# Patient Record
Sex: Male | Born: 1960 | Race: White | Hispanic: No | Marital: Single | Smoking: Current every day smoker
Health system: Southern US, Community
[De-identification: ages and names within clinical notes are randomized; demographics above are authoritative.]

## PROBLEM LIST (undated history)

## (undated) DIAGNOSIS — I10 Essential (primary) hypertension: Secondary | ICD-10-CM

## (undated) DIAGNOSIS — M109 Gout, unspecified: Secondary | ICD-10-CM

---

## 2001-03-06 ENCOUNTER — Encounter: Payer: Self-pay | Admitting: Emergency Medicine

## 2001-03-06 ENCOUNTER — Emergency Department (HOSPITAL_COMMUNITY): Admission: EM | Admit: 2001-03-06 | Discharge: 2001-03-06 | Payer: Self-pay | Admitting: Emergency Medicine

## 2002-12-21 ENCOUNTER — Emergency Department (HOSPITAL_COMMUNITY): Admission: AD | Admit: 2002-12-21 | Discharge: 2002-12-21 | Payer: Self-pay | Admitting: Family Medicine

## 2003-05-18 ENCOUNTER — Emergency Department (HOSPITAL_COMMUNITY): Admission: AD | Admit: 2003-05-18 | Discharge: 2003-05-18 | Payer: Self-pay | Admitting: Family Medicine

## 2003-12-31 ENCOUNTER — Emergency Department (HOSPITAL_COMMUNITY): Admission: AC | Admit: 2003-12-31 | Discharge: 2003-12-31 | Payer: Self-pay

## 2004-01-06 ENCOUNTER — Emergency Department (HOSPITAL_COMMUNITY): Admission: EM | Admit: 2004-01-06 | Discharge: 2004-01-06 | Payer: Self-pay | Admitting: *Deleted

## 2009-07-10 ENCOUNTER — Emergency Department (HOSPITAL_COMMUNITY): Admission: EM | Admit: 2009-07-10 | Discharge: 2009-07-10 | Payer: Self-pay | Admitting: Family Medicine

## 2010-05-29 LAB — GC/CHLAMYDIA PROBE AMP, GENITAL
Chlamydia, DNA Probe: NEGATIVE
GC Probe Amp, Genital: NEGATIVE

## 2016-03-22 ENCOUNTER — Inpatient Hospital Stay (HOSPITAL_COMMUNITY)
Admission: EM | Admit: 2016-03-22 | Discharge: 2016-03-24 | DRG: 603 | Disposition: A | Discharge status: Home / Self Care | Payer: Self-pay | Attending: Family Medicine | Admitting: Family Medicine

## 2016-03-22 DIAGNOSIS — Z720 Tobacco use: Secondary | ICD-10-CM | POA: Diagnosis present

## 2016-03-22 DIAGNOSIS — E669 Obesity, unspecified: Secondary | ICD-10-CM | POA: Diagnosis present

## 2016-03-22 DIAGNOSIS — M1000 Idiopathic gout, unspecified site: Secondary | ICD-10-CM

## 2016-03-22 DIAGNOSIS — L97511 Non-pressure chronic ulcer of other part of right foot limited to breakdown of skin: Secondary | ICD-10-CM

## 2016-03-22 DIAGNOSIS — IMO0002 Reserved for concepts with insufficient information to code with codable children: Secondary | ICD-10-CM

## 2016-03-22 DIAGNOSIS — E871 Hypo-osmolality and hyponatremia: Secondary | ICD-10-CM | POA: Diagnosis present

## 2016-03-22 DIAGNOSIS — M10071 Idiopathic gout, right ankle and foot: Secondary | ICD-10-CM | POA: Diagnosis present

## 2016-03-22 DIAGNOSIS — Z79899 Other long term (current) drug therapy: Secondary | ICD-10-CM

## 2016-03-22 DIAGNOSIS — Z6830 Body mass index (BMI) 30.0-30.9, adult: Secondary | ICD-10-CM

## 2016-03-22 DIAGNOSIS — F17210 Nicotine dependence, cigarettes, uncomplicated: Secondary | ICD-10-CM | POA: Diagnosis present

## 2016-03-22 DIAGNOSIS — D72829 Elevated white blood cell count, unspecified: Secondary | ICD-10-CM

## 2016-03-22 DIAGNOSIS — E860 Dehydration: Secondary | ICD-10-CM | POA: Diagnosis present

## 2016-03-22 DIAGNOSIS — I10 Essential (primary) hypertension: Secondary | ICD-10-CM | POA: Diagnosis present

## 2016-03-22 DIAGNOSIS — L0390 Cellulitis, unspecified: Secondary | ICD-10-CM | POA: Diagnosis present

## 2016-03-22 DIAGNOSIS — L97519 Non-pressure chronic ulcer of other part of right foot with unspecified severity: Secondary | ICD-10-CM | POA: Diagnosis present

## 2016-03-22 DIAGNOSIS — Z7982 Long term (current) use of aspirin: Secondary | ICD-10-CM

## 2016-03-22 DIAGNOSIS — R262 Difficulty in walking, not elsewhere classified: Secondary | ICD-10-CM

## 2016-03-22 DIAGNOSIS — L03115 Cellulitis of right lower limb: Principal | ICD-10-CM | POA: Diagnosis present

## 2016-03-22 HISTORY — DX: Gout, unspecified: M10.9

## 2016-03-22 HISTORY — DX: Essential (primary) hypertension: I10

## 2016-03-22 NOTE — ED Triage Notes (Signed)
Pt reports having gout getting worse for 1 month. Per [facility] pt reports he does not seek evaluations or have established PCP. Pt reports pain in BLE and hips. BP 180/90 HR 96 RR 16

## 2016-03-23 ENCOUNTER — Emergency Department (HOSPITAL_COMMUNITY): Payer: Self-pay

## 2016-03-23 ENCOUNTER — Encounter (HOSPITAL_COMMUNITY): Payer: Self-pay | Admitting: Emergency Medicine

## 2016-03-23 DIAGNOSIS — D72829 Elevated white blood cell count, unspecified: Secondary | ICD-10-CM

## 2016-03-23 DIAGNOSIS — I10 Essential (primary) hypertension: Secondary | ICD-10-CM | POA: Diagnosis present

## 2016-03-23 DIAGNOSIS — L03115 Cellulitis of right lower limb: Secondary | ICD-10-CM | POA: Diagnosis present

## 2016-03-23 DIAGNOSIS — L97519 Non-pressure chronic ulcer of other part of right foot with unspecified severity: Secondary | ICD-10-CM | POA: Insufficient documentation

## 2016-03-23 DIAGNOSIS — E871 Hypo-osmolality and hyponatremia: Secondary | ICD-10-CM | POA: Diagnosis present

## 2016-03-23 DIAGNOSIS — L0390 Cellulitis, unspecified: Secondary | ICD-10-CM | POA: Diagnosis present

## 2016-03-23 DIAGNOSIS — M1000 Idiopathic gout, unspecified site: Secondary | ICD-10-CM

## 2016-03-23 DIAGNOSIS — Z720 Tobacco use: Secondary | ICD-10-CM | POA: Diagnosis present

## 2016-03-23 LAB — CBC WITH DIFFERENTIAL/PLATELET
Basophils Absolute: 0 10*3/uL (ref 0.0–0.1)
Basophils Relative: 0 %
Eosinophils Absolute: 0.1 10*3/uL (ref 0.0–0.7)
Eosinophils Relative: 0 %
HCT: 42.9 % (ref 39.0–52.0)
Hemoglobin: 15.5 g/dL (ref 13.0–17.0)
Lymphocytes Relative: 8 %
Lymphs Abs: 1.3 10*3/uL (ref 0.7–4.0)
MCH: 30.5 pg (ref 26.0–34.0)
MCHC: 36.1 g/dL — ABNORMAL HIGH (ref 30.0–36.0)
MCV: 84.3 fL (ref 78.0–100.0)
Monocytes Absolute: 1.5 10*3/uL — ABNORMAL HIGH (ref 0.1–1.0)
Monocytes Relative: 9 %
Neutro Abs: 13.4 10*3/uL — ABNORMAL HIGH (ref 1.7–7.7)
Neutrophils Relative %: 83 %
Platelets: 240 10*3/uL (ref 150–400)
RBC: 5.09 MIL/uL (ref 4.22–5.81)
RDW: 14.1 % (ref 11.5–15.5)
WBC: 16.2 10*3/uL — ABNORMAL HIGH (ref 4.0–10.5)

## 2016-03-23 LAB — URINALYSIS, ROUTINE W REFLEX MICROSCOPIC
BILIRUBIN URINE: NEGATIVE
GLUCOSE, UA: NEGATIVE mg/dL
KETONES UR: NEGATIVE mg/dL
LEUKOCYTES UA: NEGATIVE
NITRITE: NEGATIVE
PH: 6 (ref 5.0–8.0)
Protein, ur: NEGATIVE mg/dL
SPECIFIC GRAVITY, URINE: 1.002 — AB (ref 1.005–1.030)
Squamous Epithelial / LPF: NONE SEEN

## 2016-03-23 LAB — PROTIME-INR
INR: 1.07
Prothrombin Time: 14 seconds (ref 11.4–15.2)

## 2016-03-23 LAB — APTT: aPTT: 34 seconds (ref 24–36)

## 2016-03-23 LAB — LACTIC ACID, PLASMA
Lactic Acid, Venous: 1.4 mmol/L (ref 0.5–1.9)
Lactic Acid, Venous: 1.1 mmol/L (ref 0.5–1.9)

## 2016-03-23 LAB — BASIC METABOLIC PANEL
Anion gap: 14 (ref 5–15)
BUN: 7 mg/dL (ref 6–20)
CO2: 21 mmol/L — ABNORMAL LOW (ref 22–32)
Calcium: 9.4 mg/dL (ref 8.9–10.3)
Chloride: 90 mmol/L — ABNORMAL LOW (ref 101–111)
Creatinine, Ser: 0.68 mg/dL (ref 0.61–1.24)
GFR calc Af Amer: >60 mL/min (ref >60)
GFR calc non Af Amer: >60 mL/min (ref >60)
Glucose, Bld: 115 mg/dL — ABNORMAL HIGH (ref 65–99)
Potassium: 3.7 mmol/L (ref 3.5–5.1)
Sodium: 125 mmol/L — ABNORMAL LOW (ref 135–145)

## 2016-03-23 LAB — I-STAT CG4 LACTIC ACID, ED: Lactic Acid, Venous: 1.45 mmol/L (ref 0.5–1.9)

## 2016-03-23 LAB — SEDIMENTATION RATE: Sed Rate: 66 mm/hr — ABNORMAL HIGH (ref 0–16)

## 2016-03-23 LAB — PROCALCITONIN: Procalcitonin: <0.1 ng/mL

## 2016-03-23 LAB — URIC ACID: Uric Acid, Serum: 5.3 mg/dL (ref 4.4–7.6)

## 2016-03-23 LAB — MAGNESIUM: MAGNESIUM: 1.9 mg/dL (ref 1.7–2.4)

## 2016-03-23 LAB — C-REACTIVE PROTEIN: CRP: 13.8 mg/dL — ABNORMAL HIGH (ref <1.0)

## 2016-03-23 LAB — TSH: TSH: 2.345 u[IU]/mL (ref 0.350–4.500)

## 2016-03-23 LAB — PHOSPHORUS: Phosphorus: 4.1 mg/dL (ref 2.5–4.6)

## 2016-03-23 MED ORDER — SODIUM CHLORIDE 0.9 % IV BOLUS (SEPSIS)
1000.0000 mL | Freq: Once | INTRAVENOUS | Status: AC
  Administered 2016-03-23: 1000 mL via INTRAVENOUS

## 2016-03-23 MED ORDER — IBUPROFEN 200 MG PO TABS
600.0000 mg | ORAL_TABLET | Freq: Four times a day (QID) | ORAL | Status: DC | PRN
  Filled 2016-03-23: qty 3

## 2016-03-23 MED ORDER — HYDROMORPHONE HCL 1 MG/ML IJ SOLN
1.0000 mg | Freq: Once | INTRAMUSCULAR | Status: AC
  Administered 2016-03-23: 1 mg via INTRAVENOUS
  Filled 2016-03-23: qty 1

## 2016-03-23 MED ORDER — NICOTINE 14 MG/24HR TD PT24
14.0000 mg | MEDICATED_PATCH | Freq: Every day | TRANSDERMAL | Status: DC
  Administered 2016-03-23 – 2016-03-24 (×2): 14 mg via TRANSDERMAL
  Filled 2016-03-23 (×2): qty 1

## 2016-03-23 MED ORDER — HYDROCODONE-ACETAMINOPHEN 5-325 MG PO TABS
1.0000 | ORAL_TABLET | Freq: Four times a day (QID) | ORAL | Status: DC | PRN
Start: 2016-03-23 — End: 2016-03-24
  Administered 2016-03-23 – 2016-03-24 (×3): 2 via ORAL
  Filled 2016-03-23 (×3): qty 2

## 2016-03-23 MED ORDER — SODIUM CHLORIDE 0.9 % IV SOLN
INTRAVENOUS | Status: DC
  Administered 2016-03-23: 20:00:00 via INTRAVENOUS

## 2016-03-23 MED ORDER — PREDNISONE 20 MG PO TABS
40.0000 mg | ORAL_TABLET | Freq: Every day | ORAL | Status: DC
  Administered 2016-03-23: 40 mg via ORAL
  Filled 2016-03-23: qty 2

## 2016-03-23 MED ORDER — VANCOMYCIN HCL 10 G IV SOLR
1250.0000 mg | Freq: Once | INTRAVENOUS | Status: AC
  Administered 2016-03-23: 1250 mg via INTRAVENOUS
  Filled 2016-03-23 (×2): qty 1250

## 2016-03-23 MED ORDER — PREDNISONE 50 MG PO TABS
50.0000 mg | ORAL_TABLET | Freq: Every day | ORAL | Status: DC
  Administered 2016-03-23 – 2016-03-24 (×2): 50 mg via ORAL
  Filled 2016-03-23 (×2): qty 1

## 2016-03-23 MED ORDER — HYDROCODONE-ACETAMINOPHEN 5-325 MG PO TABS
1.0000 | ORAL_TABLET | Freq: Four times a day (QID) | ORAL | Status: DC | PRN
  Administered 2016-03-23: 2 via ORAL
  Filled 2016-03-23: qty 2

## 2016-03-23 MED ORDER — PANTOPRAZOLE SODIUM 40 MG PO TBEC
40.0000 mg | DELAYED_RELEASE_TABLET | Freq: Every day | ORAL | Status: DC
  Administered 2016-03-23: 40 mg via ORAL
  Filled 2016-03-23: qty 1

## 2016-03-23 MED ORDER — ONDANSETRON HCL 4 MG/2ML IJ SOLN: 4.0000 mg | Freq: Four times a day (QID) | INTRAMUSCULAR | Status: DC | PRN

## 2016-03-23 MED ORDER — ONDANSETRON HCL 4 MG PO TABS
4.0000 mg | ORAL_TABLET | Freq: Four times a day (QID) | ORAL | Status: DC | PRN
  Administered 2016-03-24: 4 mg via ORAL
  Filled 2016-03-23: qty 1

## 2016-03-23 MED ORDER — PIPERACILLIN-TAZOBACTAM 3.375 G IVPB 30 MIN
3.3750 g | Freq: Once | INTRAVENOUS | Status: AC
  Administered 2016-03-23: 3.375 g via INTRAVENOUS
  Filled 2016-03-23: qty 50

## 2016-03-23 MED ORDER — VANCOMYCIN HCL IN DEXTROSE 1-5 GM/200ML-% IV SOLN
1000.0000 mg | Freq: Three times a day (TID) | INTRAVENOUS | Status: DC
  Administered 2016-03-23 – 2016-03-24 (×3): 1000 mg via INTRAVENOUS
  Filled 2016-03-23 (×2): qty 200

## 2016-03-23 MED ORDER — ACETAMINOPHEN 650 MG RE SUPP: 650.0000 mg | Freq: Four times a day (QID) | RECTAL | Status: DC | PRN

## 2016-03-23 MED ORDER — VANCOMYCIN HCL IN DEXTROSE 1-5 GM/200ML-% IV SOLN: 1000.0000 mg | Freq: Once | INTRAVENOUS | Status: DC

## 2016-03-23 MED ORDER — ALLOPURINOL 300 MG PO TABS
150.0000 mg | ORAL_TABLET | Freq: Every day | ORAL | Status: DC
  Administered 2016-03-23 – 2016-03-24 (×2): 150 mg via ORAL
  Filled 2016-03-23 (×2): qty 1

## 2016-03-23 MED ORDER — HEPARIN SODIUM (PORCINE) 5000 UNIT/ML IJ SOLN
5000.0000 [IU] | Freq: Three times a day (TID) | INTRAMUSCULAR | Status: DC
  Administered 2016-03-23 – 2016-03-24 (×3): 5000 [IU] via SUBCUTANEOUS
  Filled 2016-03-23 (×3): qty 1

## 2016-03-23 MED ORDER — ACETAMINOPHEN 325 MG PO TABS: 650.0000 mg | ORAL_TABLET | Freq: Four times a day (QID) | ORAL | Status: DC | PRN

## 2016-03-23 MED ORDER — SODIUM CHLORIDE 0.9 % IV SOLN
INTRAVENOUS | Status: DC
  Administered 2016-03-23: 04:00:00 via INTRAVENOUS

## 2016-03-23 NOTE — Progress Notes (Signed)
Pharmacy Antibiotic Note  Darrell PeltonJeffrey A Ritter is a 56 y.o. male admitted on 03/22/2016 with purulent cellulitis of mid right foot. Pharmacy has been consulted for Vancomycin dosing. He is afebrile with mild leukocytosis.  MRI foot is still pending to rule out abscess or osteomyelitis (although Xray was negative). Renal function is at patient's baseline.  Estimated CrCl > 16900ml/min.  Vancomycin 1250mg  IV x1 was given in ED.   Plan: Vancomycin 1000mg  IV every 8 hours.  Goal trough 15-20 mcg/mL. (can target lower goal if MRI negative) Check Vancomycin trough at steady state Monitor renal function and cx data   Height: 5\' 8"  (172.7 cm) Weight: 200 lb (90.7 kg) IBW/kg (Calculated) : 68.4  Temp (24hrs), Avg:98.6 F (37 C), Min:98.4 F (36.9 C), Max:98.7 F (37.1 C)   Recent Labs Lab 03/23/16 0126 03/23/16 0359 03/23/16 0414 03/23/16 0707  WBC 16.2*  --   --   --   CREATININE 0.68  --   --   --   LATICACIDVEN  --  1.4 1.45 1.1    Estimated Creatinine Clearance: 116.8 mL/min (by C-G formula based on SCr of 0.68 mg/dL).    No Known Allergies  Antimicrobials this admission: 1/13 Vanc >>  1/13 Zosyn x1 dose in ED   Dose adjustments this admission:  Microbiology results: 1/13 BCx: sent  Thank you for allowing pharmacy to be a part of this patient's care.  Elson ClanLilliston, Neil Errickson Michelle 03/23/2016 9:52 AM

## 2016-03-23 NOTE — ED Notes (Signed)
RN to draw labs. 

## 2016-03-23 NOTE — ED Triage Notes (Signed)
Pt has hx gout. Knee and foot is swollen and pt c/o L sided hip pain new onset x1 day. Pt reports gout in RLE for numerous years however has worsened since christmas time. Pt reports he has not taken medication for gout since aproxx 3-4 months ago.

## 2016-03-23 NOTE — ED Notes (Signed)
Please Contact if any changes- Sister Victorino DikeJennifer: [phone] Winfred BurnMom Faye: [phone]

## 2016-03-23 NOTE — ED Provider Notes (Signed)
[facility]-EMERGENCY DEPT Provider Note   CSN: [id number] Arrival date & time: 03/22/16  2312  By signing my name below, I, Alyssa Grove, attest that this documentation has been prepared under the direction and in the presence of Blane Ohara, MD. Electronically Signed: Alyssa Grove, ED Scribe. 03/23/16. 12:35 AM.   History   Chief Complaint Chief Complaint  Patient presents with  . Gout  . Knee Pain  . Hip Pain   The history is provided by the patient. No language interpreter was used.   HPI Comments: Darrell Ritter is a 56 y.o. male who presents to the Emergency Department complaining of gradual onset, constant, moderate right knee pain and swelling for 1 day. Pt states pain is similar to prior gout flare-ups. Pt has hx of gout in hip, back, and shoulders. He normally takes 2x 20 mg of Prednisone for relief. Pt denies recent trauma or injury to knee. Pt lives alone and has not been able to take a bath. He has never had an infection to his bone or joints. He denies fever, vomiting.  Pt had gradual onset, progressively worsening, constant right foot erythema and edema for over a year. Right foot appears infected. Pt states he is ambulatory.  Past Medical History:  Diagnosis Date  . Gout   . Hypertension     Patient Active Problem List   Diagnosis Date Noted  . Right foot ulcer (HCC) 03/23/2016    History reviewed. No pertinent surgical history.     Home Medications    Prior to Admission medications   Not on File    Family History History reviewed. No pertinent family history.  Social History Social History  Substance Use Topics  . Smoking status: Current Every Day Smoker    Packs/day: 1.00    Types: Cigarettes  . Smokeless tobacco: Never Used  . Alcohol use Yes     Comment: occasional     Allergies   Patient has no known allergies.   Review of Systems Review of Systems  Constitutional: Negative for fever.  Gastrointestinal: Negative for vomiting.    Musculoskeletal: Positive for arthralgias and joint swelling.  Skin: Positive for color change and wound.  All other systems reviewed and are negative.  Physical Exam Updated Vital Signs BP 136/97 (BP Location: Right Arm)   Pulse 101   Temp 98.7 F (37.1 C) (Oral)   Resp 16   Ht 5\' 8"  (1.727 m)   Wt 200 lb (90.7 kg)   SpO2 96%   BMI 30.41 kg/m   Physical Exam  Constitutional: He is oriented to person, place, and time. He appears well-developed and well-nourished. He is active. No distress.  HENT:  Head: Normocephalic and atraumatic.  Eyes: Conjunctivae are normal.  Cardiovascular: Normal rate.   Pulmonary/Chest: Effort normal. No respiratory distress.  Musculoskeletal: Normal range of motion. He exhibits edema and tenderness.  Tenderness to flexion and extension of right knee Pain with flexion of the left hip Effusion in right knee joint  Neurological: He is alert and oriented to person, place, and time.  Skin: Skin is warm and dry.  2 cm ulcer with mild purulence  Swelling to right foot Mild tenderness to palpation Tender and effusion right knee joint, no erythema or warmth, pain with flexion  Psychiatric: He has a normal mood and affect. His behavior is normal.  Nursing note and vitals reviewed.  ED Treatments / Results  DIAGNOSTIC STUDIES: Oxygen Saturation is 98% on RA, normal by my interpretation.  COORDINATION OF CARE: 12:25 AM Discussed treatment plan with pt at bedside which includes Dilaudid, XRs of right knee, left hip, and right foot, blood culture, BMP and CBC with differential/platelet and pt agreed to plan.  Labs (all labs ordered are listed, but only abnormal results are displayed) Labs Reviewed  SEDIMENTATION RATE - Abnormal; Notable for the following:       Result Value   Sed Rate 66 (*)    All other components within normal limits  CBC WITH DIFFERENTIAL/PLATELET - Abnormal; Notable for the following:    WBC 16.2 (*)    MCHC 36.1 (*)     Neutro Abs 13.4 (*)    Monocytes Absolute 1.5 (*)    All other components within normal limits  BASIC METABOLIC PANEL - Abnormal; Notable for the following:    Sodium 125 (*)    Chloride 90 (*)    CO2 21 (*)    Glucose, Bld 115 (*)    All other components within normal limits  CULTURE, BLOOD (ROUTINE X 2)  CULTURE, BLOOD (ROUTINE X 2)  C-REACTIVE PROTEIN  LACTIC ACID, PLASMA  LACTIC ACID, PLASMA  PROCALCITONIN  PROTIME-INR  APTT  I-STAT CG4 LACTIC ACID, ED    EKG  EKG Interpretation None       Radiology Dg Knee Complete 4 Views Right  Result Date: 03/23/2016 CLINICAL DATA:  History of gout. Swelling of the right knee and foot with left-sided hip pain new for 1 day. EXAM: RIGHT KNEE - COMPLETE 4+ VIEW COMPARISON:  None. FINDINGS: There is a moderate size right knee effusion. Bone erosions are demonstrated along the lateral femoral condyles consistent with gout. There may also be small erosions on the lateral tibial plateau. Degenerative narrowing of the medial compartment. Diffuse soft tissue swelling. Vascular calcifications. IMPRESSION: Bone erosions consistent with gout demonstrated along the lateral femoral condyles and probably lateral tibial plateau with associated right knee effusion and soft tissue swelling. Electronically Signed   By: Burman Nieves M.D.   On: 03/23/2016 01:42   Dg Foot Complete Right  Result Date: 03/23/2016 CLINICAL DATA:  History of gout. Knee and foot are swollen. Left-sided hip pain. Onset 1 day. EXAM: RIGHT FOOT COMPLETE - 3+ VIEW COMPARISON:  None. FINDINGS: Bone erosions of the medial aspect of the first metatarsal head with overlying soft tissue swelling consistent with involvement of gout. Degenerative changes in the interphalangeal joints and first metatarsal-phalangeal joint as well as in the intertarsal joints. Dorsal soft tissue swelling of the right foot. Small calcaneal spurs. No acute fracture or dislocation. No radiopaque soft tissue  foreign bodies. IMPRESSION: Bone erosion along the medial aspect of the first metatarsal head consistent with gout. Degenerative changes. Electronically Signed   By: Burman Nieves M.D.   On: 03/23/2016 01:41   Dg Hip Unilat W Or Wo Pelvis 2-3 Views Left  Result Date: 03/23/2016 CLINICAL DATA:  Left hip pain for 1 day.  History of gout. EXAM: DG HIP (WITH OR WITHOUT PELVIS) 2-3V LEFT COMPARISON:  None. FINDINGS: Degenerative changes in the lower lumbar spine and hips. Pelvis and left hip appear intact. No evidence of acute fracture or dislocation. No bone or cortical erosion. No focal bone lesion or bone destruction. Soft tissues are unremarkable. Vascular calcifications. IMPRESSION: Degenerative changes in the left hip.  No acute bony abnormalities. Electronically Signed   By: Burman Nieves M.D.   On: 03/23/2016 01:43    Procedures Procedures (including critical care time)  Medications Ordered in ED  Medications  sodium chloride 0.9 % bolus 1,000 mL (not administered)  0.9 %  sodium chloride infusion (not administered)  vancomycin (VANCOCIN) 1,361 mg in sodium chloride 0.9 % 500 mL IVPB (not administered)  piperacillin-tazobactam (ZOSYN) IVPB 3.375 g (not administered)  HYDROmorphone (DILAUDID) injection 1 mg (1 mg Intravenous Given 03/23/16 0139)     Initial Impression / Assessment and Plan / ED Course  I have reviewed the triage vital signs and the nursing notes.  Pertinent labs & imaging results that were available during my care of the patient were reviewed by me and considered in my medical decision making (see chart for details).  Clinical Course    Patient presents initially for acute on chronic gout flare. On exam patient is noted to have clinically acute infection of the right foot. Patient's leukocytosis, elevated sedimentation rate, difficult social situation with no primary physician and challenging financial situation. Discussed with tried hospitalist, discussed  antibiotics cultures and admission.  The patients results and plan were reviewed and discussed.   Any x-rays performed were independently reviewed by myself.   Differential diagnosis were considered with the presenting HPI.  Medications  sodium chloride 0.9 % bolus 1,000 mL (not administered)  0.9 %  sodium chloride infusion (not administered)  vancomycin (VANCOCIN) 1,361 mg in sodium chloride 0.9 % 500 mL IVPB (not administered)  piperacillin-tazobactam (ZOSYN) IVPB 3.375 g (not administered)  HYDROmorphone (DILAUDID) injection 1 mg (1 mg Intravenous Given 03/23/16 0139)    Vitals:   03/22/16 2314 03/23/16 0024 03/23/16 0027 03/23/16 0245  BP: 161/78  184/95 136/97  Pulse: 101  104 101  Resp: 18  16 16   Temp: 98.6 F (37 C)   98.7 F (37.1 C)  TempSrc: Oral   Oral  SpO2: 98%  100% 96%  Weight: 200 lb (90.7 kg) 200 lb (90.7 kg)    Height: 5\' 8"  (1.727 m) 5\' 8"  (1.727 m)      Final diagnoses:  Skin ulcer of right foot, limited to breakdown of skin (HCC)  Acute idiopathic gout, unspecified site  Leukocytosis, unspecified type  Ulcer (HCC)    Admission/ observation were discussed with the admitting physician, patient and/or family and they are comfortable with the plan.   Final Clinical Impressions(s) / ED Diagnoses   Final diagnoses:  Skin ulcer of right foot, limited to breakdown of skin (HCC)  Acute idiopathic gout, unspecified site  Leukocytosis, unspecified type    New Prescriptions New Prescriptions   No medications on file      Blane OharaJoshua Romanda Turrubiates, MD 03/23/16 [phone]

## 2016-03-23 NOTE — H&P (Addendum)
History and Physical    Darrell PeltonJeffrey A Palazzi WUJ:[id number] DOB: 01-Dec-1960 DOA: 03/22/2016  Referring Provider: Dr. Jodi MourningZavitz PCP: No PCP Per Patient   Patient coming from: home  Chief Complaint: joint pain, decrease range of motion; feels to have gout flare.  HPI: Darrell PeltonJeffrey A Waas is a 56 y.o. male with PMH significant for tobacco abuse, HTN and gout; who presented to Ed complaining of significant swelling of his joint and decrease range of motion. Patient reports some subjective fever and chills at home. No CP, no SOB, no nausea, no vomiting, no HA's, no blurred vision, no abdominal pain, no dysuria or any other complaints.  While in ED was found to have right foot open ulcer with surrounding erythema and purulent discharge; when questioning, he expressed that about 2-3 weeks ago, he had a blister that burst on its own in that area and subsequently noticed redness, pain and swelling.  Patient endorses some chronic swelling on his feet and that he has noticed, on and off calloses and blisters in association with his shoes.  ED Course: patient found to have hyponatremia, leukocytosis and open purulent right mid foot ulcer. WBC's 16,000 range. Blood cx's taken and then Started on IV antibiotics. [facility] called to admit patient for further evaluation and treatment.   Review of Systems:  All other systems reviewed and apart from HPI, are negative.  Past Medical History:  Diagnosis Date  . Gout   . Hypertension     History reviewed. No pertinent surgical history.   reports that he has been smoking Cigarettes.  He has been smoking about 1.00 pack per day. He has never used smokeless tobacco. He reports that he drinks alcohol. He reports that he does not use drugs.  No Known Allergies  History reviewed. Significant for HTN only.   Prior to Admission medications   Medication Sig Start Date End Date Taking? Authorizing Provider  aspirin 325 MG tablet Take 325-650 mg by mouth every 6 (six) hours as  needed for moderate pain.   Yes Historical Provider, MD  ibuprofen (ADVIL,MOTRIN) 200 MG tablet Take 400 mg by mouth every 6 (six) hours as needed for moderate pain.   Yes Historical Provider, MD    Physical Exam: Vitals:   03/23/16 0024 03/23/16 0027 03/23/16 0245 03/23/16 0520  BP:  184/95 136/97 (!) 154/90  Pulse:  104 101 97  Resp:  16 16 18   Temp:   98.7 F (37.1 C) 98.4 F (36.9 C)  TempSrc:   Oral Oral  SpO2:  100% 96% 100%  Weight: 90.7 kg (200 lb)     Height: 5\' 8"  (1.727 m)       Constitutional: Comfortable, complaining of pain in his joints and also around right foot ulcer. No fever at this point. Eyes: PERTLA, lids and conjunctivae normal, no icterus  ENMT: Mucous membranes slightly dry. Posterior pharynx clear of any exudate or lesions. No thrush.  Neck: normal, supple, no masses, no thyromegaly, no JVD Respiratory: clear to auscultation bilaterally, no wheezing, no crackles. Normal respiratory effort. No accessory muscle use.  Cardiovascular: S1 & S2 heard, regular rate and rhythm, positive SEM; no  rubs or gallops. No extremity edema. 2+ pedal pulses. No carotid bruits.  Abdomen: No distension, no tenderness, no masses palpated. No hepatosplenomegaly. Bowel sounds normal.  Musculoskeletal: no cyanosis appreciated. Patient with bilateral ankle, (R > L) and bilateral knees (R > L) swelling. Decrease range of motion appreciated and pain on these joints and also on his  lower back. Skin: right foot with erythema, swelling and open mid dorsal ulcer (purulence appreciated). Patient reports pain to palpation. 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 Neurologic: CN 2-12 grossly intact. Sensation intact, DTR normal. Strength 5/5 in all 4 limbs.  Psychiatric: Normal judgment and insight. Alert and oriented x 3. Normal mood.    Labs on Admission: I have personally reviewed following labs and imaging studies  CBC:  Recent Labs Lab 03/23/16 0126   WBC 16.2*  NEUTROABS 13.4*  HGB 15.5  HCT 42.9  MCV 84.3  PLT 240   Basic Metabolic Panel:  Recent Labs Lab 03/23/16 0126  NA 125*  K 3.7  CL 90*  CO2 21*  GLUCOSE 115*  BUN 7  CREATININE 0.68  CALCIUM 9.4   GFR: Estimated Creatinine Clearance: 116.8 mL/min (by C-G formula based on SCr of 0.68 mg/dL).  Coagulation Profile:  Recent Labs Lab 03/23/16 0359  INR 1.07   Sepsis Labs: Normal Lactic acid level  Radiological Exams on Admission: Dg Knee Complete 4 Views Right  Result Date: 03/23/2016 CLINICAL DATA:  History of gout. Swelling of the right knee and foot with left-sided hip pain new for 1 day. EXAM: RIGHT KNEE - COMPLETE 4+ VIEW COMPARISON:  None. FINDINGS: There is a moderate size right knee effusion. Bone erosions are demonstrated along the lateral femoral condyles consistent with gout. There may also be small erosions on the lateral tibial plateau. Degenerative narrowing of the medial compartment. Diffuse soft tissue swelling. Vascular calcifications. IMPRESSION: Bone erosions consistent with gout demonstrated along the lateral femoral condyles and probably lateral tibial plateau with associated right knee effusion and soft tissue swelling. Electronically Signed   By: Burman Nieves M.D.   On: 03/23/2016 01:42   Dg Foot Complete Right  Result Date: 03/23/2016 CLINICAL DATA:  History of gout. Knee and foot are swollen. Left-sided hip pain. Onset 1 day. EXAM: RIGHT FOOT COMPLETE - 3+ VIEW COMPARISON:  None. FINDINGS: Bone erosions of the medial aspect of the first metatarsal head with overlying soft tissue swelling consistent with involvement of gout. Degenerative changes in the interphalangeal joints and first metatarsal-phalangeal joint as well as in the intertarsal joints. Dorsal soft tissue swelling of the right foot. Small calcaneal spurs. No acute fracture or dislocation. No radiopaque soft tissue foreign bodies. IMPRESSION: Bone erosion along the medial aspect  of the first metatarsal head consistent with gout. Degenerative changes. Electronically Signed   By: Burman Nieves M.D.   On: 03/23/2016 01:41   Dg Hip Unilat W Or Wo Pelvis 2-3 Views Left  Result Date: 03/23/2016 CLINICAL DATA:  Left hip pain for 1 day.  History of gout. EXAM: DG HIP (WITH OR WITHOUT PELVIS) 2-3V LEFT COMPARISON:  None. FINDINGS: Degenerative changes in the lower lumbar spine and hips. Pelvis and left hip appear intact. No evidence of acute fracture or dislocation. No bone or cortical erosion. No focal bone lesion or bone destruction. Soft tissues are unremarkable. Vascular calcifications. IMPRESSION: Degenerative changes in the left hip.  No acute bony abnormalities. Electronically Signed   By: Burman Nieves M.D.   On: 03/23/2016 01:43    EKG:  -None  Assessment/Plan 1-acute purulent cellulitis of right mid foot: erythema, pain, swelling and purulence discharge appreciated -will check MRI of foot to assess for abscess and/or bone infection -will treat with IV antibiotics (vancomycin) -IVF's and supportive care -patient with WBC's 16.2, currently afebrile (but per daughter, with some subjective fever  at home); also tachycardic with HR 105 on admission. -will follow clinical response  -cellulitis order set used; will follow cx's  2-Leukocytosis -due to #1 vs demargination with gout flare -will provide antibiotic coverage as mentioned above -provide IVF's and treatment for gout -will follow WBC's trend   3-Acute idiopathic gout: patient with gout for over 20 years and multiple flares throughout this time. -complaining mainly of pain in his right ankle, right hand (with big tophi), Lower back and bilateral knees. -started on prednisone and PRN ibuprofen  -will also start allopurinol (while receiving treatment for acute flare with steroids) -will check uric acid level  -educated about diet, hydration and decrease in alcoholic beverages  -further titration on  allopurinol dose, base on his uric acid level down the road (goal is to be around 6)  4-HTN (hypertension) -not on any meds at home -will follow heart healthy diet and follow VS -if needed will start patient on maintenance antihypertensive regimen   5-Tobacco abuse -cessation counseling provided -nicotine patch ordered  6-Hyponatremia -will provide IVF's resuscitation (chloride also low) -will check TSH, Mg and Phosphorus  -will follow electrolytes trend   7-obesity: class 1 -Body mass index is 30.41 kg/m. -due to excess calorie intake -low calorie diet and exercise discussed with patient   Time: 60 minutes   DVT prophylaxis: heparin   Code Status: Full Family Communication: daughter at bedside   Disposition Plan: PT will assess patient; but anticipate home once medically stable (in 2 days or so) Consults called: none  Admission status: inpatient, LOS > 2 midnights, med-surg bed    Vassie Loll MD [facility] Pager [phone]  If 7PM-7AM, please contact night-coverage [email or web address] Password [email or web address]  03/23/2016, 9:46 AM

## 2016-03-24 ENCOUNTER — Inpatient Hospital Stay (HOSPITAL_COMMUNITY): Payer: Self-pay

## 2016-03-24 DIAGNOSIS — I10 Essential (primary) hypertension: Secondary | ICD-10-CM

## 2016-03-24 DIAGNOSIS — Z720 Tobacco use: Secondary | ICD-10-CM

## 2016-03-24 DIAGNOSIS — E871 Hypo-osmolality and hyponatremia: Secondary | ICD-10-CM

## 2016-03-24 DIAGNOSIS — D72829 Elevated white blood cell count, unspecified: Secondary | ICD-10-CM

## 2016-03-24 DIAGNOSIS — M1000 Idiopathic gout, unspecified site: Secondary | ICD-10-CM

## 2016-03-24 DIAGNOSIS — L03115 Cellulitis of right lower limb: Principal | ICD-10-CM

## 2016-03-24 LAB — BASIC METABOLIC PANEL
Anion gap: 8 (ref 5–15)
BUN: 11 mg/dL (ref 6–20)
CO2: 27 mmol/L (ref 22–32)
CREATININE: 0.6 mg/dL — AB (ref 0.61–1.24)
Calcium: 9.3 mg/dL (ref 8.9–10.3)
Chloride: 100 mmol/L — ABNORMAL LOW (ref 101–111)
GFR calc non Af Amer: >60 mL/min (ref >60)
GLUCOSE: 164 mg/dL — AB (ref 65–99)
Potassium: 4.1 mmol/L (ref 3.5–5.1)
Sodium: 135 mmol/L (ref 135–145)

## 2016-03-24 LAB — HIV ANTIBODY (ROUTINE TESTING W REFLEX): HIV Screen 4th Generation wRfx: NONREACTIVE

## 2016-03-24 LAB — CBC
HCT: 41.5 % (ref 39.0–52.0)
Hemoglobin: 14.3 g/dL (ref 13.0–17.0)
MCH: 29.5 pg (ref 26.0–34.0)
MCHC: 34.5 g/dL (ref 30.0–36.0)
MCV: 85.7 fL (ref 78.0–100.0)
PLATELETS: 227 10*3/uL (ref 150–400)
RBC: 4.84 MIL/uL (ref 4.22–5.81)
RDW: 13.6 % (ref 11.5–15.5)
WBC: 8.5 10*3/uL (ref 4.0–10.5)

## 2016-03-24 MED ORDER — GADOBENATE DIMEGLUMINE 529 MG/ML IV SOLN
19.0000 mL | Freq: Once | INTRAVENOUS | Status: AC | PRN
  Administered 2016-03-24: 19 mL via INTRAVENOUS

## 2016-03-24 MED ORDER — DOXYCYCLINE HYCLATE 100 MG PO CAPS: 100.0000 mg | ORAL_CAPSULE | Freq: Two times a day (BID) | ORAL | 0 refills | Status: AC

## 2016-03-24 MED ORDER — ALLOPURINOL 100 MG PO TABS: 100.0000 mg | ORAL_TABLET | Freq: Every day | ORAL | 0 refills | Status: DC

## 2016-03-24 MED ORDER — LORAZEPAM 1 MG PO TABS
1.0000 mg | ORAL_TABLET | Freq: Once | ORAL | Status: AC | PRN
  Administered 2016-03-24: 1 mg via ORAL
  Filled 2016-03-24: qty 1

## 2016-03-24 MED ORDER — PREDNISONE 20 MG PO TABS: ORAL_TABLET | ORAL | 0 refills | Status: DC

## 2016-03-24 NOTE — Discharge Summary (Signed)
Physician Discharge Summary  Darrell Ritter ZOX:[id number] DOB: 06-30-1960 DOA: 03/22/2016  PCP: No PCP Per Patient (Patient declined case manager consult for PCP needs)  Admit date: 03/22/2016 Discharge date: 03/24/2016  Admitted From: Home Disposition:  Home  Recommendations for Outpatient Follow-up:  1. Follow up with PCP in 1 week 2. Hypertension management 3. Please follow up on the following pending results: Blood cultures   Discharge Condition: Stable CODE STATUS: Full code  Brief/Interim Summary:  HPI written by Vassie Loll, MD on 03/23/2016  Chief Complaint: joint pain, decrease range of motion; feels to have gout flare.  HPI: Darrell Ritter is a 56 y.o. male with PMH significant for tobacco abuse, HTN and gout; who presented to Ed complaining of significant swelling of his joint and decrease range of motion. Patient reports some subjective fever and chills at home. No CP, no SOB, no nausea, no vomiting, no HA's, no blurred vision, no abdominal pain, no dysuria or any other complaints.  While in ED was found to have right foot open ulcer with surrounding erythema and purulent discharge; when questioning, he expressed that about 2-3 weeks ago, he had a blister that burst on its own in that area and subsequently noticed redness, pain and swelling.  Patient endorses some chronic swelling on his feet and that he has noticed, on and off calloses and blisters in association with his shoes.  ED Course: patient found to have hyponatremia, leukocytosis and open purulent right mid foot ulcer. WBC's 16,000 range. Blood cx's taken and then Started on IV antibiotics. [facility] called to admit patient for further evaluation and treatment.    Hospital course:  Purulent cellulitis of right foot Patient empirically treated with Vancomycin. MRI suggests no osteomyelitis but rather changes secondary to chronic gout. Transitioned to doxycycline at discharge.  Leukocytosis Secondary to gout  flare vs cellulitis vs combination. Given IV fluids and antibiotics. Resolved.  Acute idiopathic gout Started on prednisone. Initially started on allopurinol, but held until acute flare has resolved. Prednisone taper given at discharge.  Essential hypertension Not on medications as an outpatient.  Tobacco abuse Counseling provided and nicotine patches given while inpatient  Hyponatremia Likely secondary to dehydration. IV fluids given with improvement.  Obesity BMI of 30.41 kg/m   Discharge Diagnoses:  Active Problems:   Leukocytosis   Acute idiopathic gout   HTN (hypertension)   Tobacco abuse   Hyponatremia   Cellulitis   Cellulitis of foot without toes, right    Discharge Instructions   Allergies as of 03/24/2016   No Known Allergies     Medication List    STOP taking these medications   aspirin 325 MG tablet     TAKE these medications   allopurinol 100 MG tablet Commonly known as:  ZYLOPRIM Take 1 tablet (100 mg total) by mouth daily.   doxycycline 100 MG capsule Commonly known as:  VIBRAMYCIN Take 1 capsule (100 mg total) by mouth 2 (two) times daily. Start taking on:  03/25/2016   ibuprofen 200 MG tablet Commonly known as:  ADVIL,MOTRIN Take 400 mg by mouth every 6 (six) hours as needed for moderate pain.   predniSONE 20 MG tablet Commonly known as:  DELTASONE Take 60mg  x 2 days, 40mg  x 2 days, 20mg  x 3 days       No Known Allergies  Consultations:  None   Procedures/Studies: Mr Foot Right W Wo Contrast  Result Date: 03/24/2016 CLINICAL DATA:  Pain between the first and second toes. Gout.  Blistering. Unstable gait. Ulcer with purulent discharge. EXAM: MRI OF THE RIGHT FOREFOOT WITHOUT AND WITH CONTRAST TECHNIQUE: Multiplanar, multisequence MR imaging of the right forefoot was performed before and after the administration of intravenous contrast. CONTRAST:  19mL MULTIHANCE GADOBENATE DIMEGLUMINE 529 MG/ML IV SOLN COMPARISON:  03/23/2016  FINDINGS: Bones/Joint/Cartilage Extensive synovitis and erosive arthropathy at the first MTP joint more characteristic of gout than infection. The is a 1.5 cm thick rind of enhancing tissue extending around the head of the first metatarsal and joint associated with bony erosion and potential destruction of the medial sesamoid and large bony erosions in the head of the first metatarsal. There is a similar but smaller soft tissue mass associated with the fifth MTP joint lateral to the head of the fifth metatarsal with mild erosion of the head of the fifth metatarsal, likewise characteristic of gout. There is a third soft tissue mass extending along the fourth intermetatarsal joint and above the base of the fifth metatarsal with associated erosion of the lateral base of the fourth metatarsal. Image 12 series 11 there is a 9 by 5 mm focus of abnormal enhancement and edema in the proximal metadiaphyseal medullary space of the third metatarsal, technically nonspecific but possibly a localize stress fracture. I am skeptical that this represents a focal osteomyelitis. Ligaments Lisfranc ligament intact. Muscles and Tendons Edema tracks along the plantar musculotendinous structures of the foot in a diffuse fashion. Similar low-grade edema in the extensor digitorum brevis muscle. Soft tissues Diffuse subcutaneous edema along the dorsum of the foot without observed enhancement. Edema extends into the toes especially the first through third toes. IMPRESSION: 1. Enhancing periarticular soft tissue masses associated with the first MTP joint, fifth MTP joint, and fourth intermetatarsal joint with associated erosions, characteristic for gout. There is severe erosion and essentially destruction of the first digit medial sesamoid. Strictly speaking it is difficult to totally exclude superinfection particularly at the first MTP joint, but the findings are thought to be reasonably accounted for by the diagnosis of gout. 2. Diffuse  subcutaneous edema in the foot. Although not discernibly enhancing, this could conceivably represent cellulitis. 3. There is also low-grade edema tracking within along the musculotendinous structures of the forefoot. 4. No drainable abscess. Electronically Signed   By: Gaylyn Rong M.D.   On: 03/24/2016 13:18   Dg Knee Complete 4 Views Right  Result Date: 03/23/2016 CLINICAL DATA:  History of gout. Swelling of the right knee and foot with left-sided hip pain new for 1 day. EXAM: RIGHT KNEE - COMPLETE 4+ VIEW COMPARISON:  None. FINDINGS: There is a moderate size right knee effusion. Bone erosions are demonstrated along the lateral femoral condyles consistent with gout. There may also be small erosions on the lateral tibial plateau. Degenerative narrowing of the medial compartment. Diffuse soft tissue swelling. Vascular calcifications. IMPRESSION: Bone erosions consistent with gout demonstrated along the lateral femoral condyles and probably lateral tibial plateau with associated right knee effusion and soft tissue swelling. Electronically Signed   By: Burman Nieves M.D.   On: 03/23/2016 01:42   Dg Foot Complete Right  Result Date: 03/23/2016 CLINICAL DATA:  History of gout. Knee and foot are swollen. Left-sided hip pain. Onset 1 day. EXAM: RIGHT FOOT COMPLETE - 3+ VIEW COMPARISON:  None. FINDINGS: Bone erosions of the medial aspect of the first metatarsal head with overlying soft tissue swelling consistent with involvement of gout. Degenerative changes in the interphalangeal joints and first metatarsal-phalangeal joint as well as in the intertarsal joints.  Dorsal soft tissue swelling of the right foot. Small calcaneal spurs. No acute fracture or dislocation. No radiopaque soft tissue foreign bodies. IMPRESSION: Bone erosion along the medial aspect of the first metatarsal head consistent with gout. Degenerative changes. Electronically Signed   By: Burman NievesWilliam  Stevens M.D.   On: 03/23/2016 01:41   Dg  Hip Unilat W Or Wo Pelvis 2-3 Views Left  Result Date: 03/23/2016 CLINICAL DATA:  Left hip pain for 1 day.  History of gout. EXAM: DG HIP (WITH OR WITHOUT PELVIS) 2-3V LEFT COMPARISON:  None. FINDINGS: Degenerative changes in the lower lumbar spine and hips. Pelvis and left hip appear intact. No evidence of acute fracture or dislocation. No bone or cortical erosion. No focal bone lesion or bone destruction. Soft tissues are unremarkable. Vascular calcifications. IMPRESSION: Degenerative changes in the left hip.  No acute bony abnormalities. Electronically Signed   By: Burman NievesWilliam  Stevens M.D.   On: 03/23/2016 01:43     Subjective: Patient reports no worsening symptoms today. Drainage has slowed.  Discharge Exam: Vitals:   03/24/16 0453 03/24/16 1440  BP: (!) 152/96 (!) 154/80  Pulse: 91 99  Resp: 18 20  Temp: 98 F (36.7 C) 97.7 F (36.5 C)   Vitals:   03/23/16 1322 03/23/16 2039 03/24/16 0453 03/24/16 1440  BP: (!) 148/90 (!) 154/82 (!) 152/96 (!) 154/80  Pulse: 89 92 91 99  Resp: 18 18 18 20   Temp: 98.3 F (36.8 C) 97.8 F (36.6 C) 98 F (36.7 C) 97.7 F (36.5 C)  TempSrc: Oral Oral Oral Oral  SpO2: 99% 98% 99% 99%  Weight:      Height:        General: Pt is alert, awake, not in acute distress Cardiovascular: RRR, S1/S2 +, no rubs, no gallops Respiratory: CTA bilaterally, no wheezing, no rhonchi Abdominal: Soft, NT, ND, bowel sounds + Extremities: no edema, no cyanosis. Right foot with significant erythema and ulcer over first MTP. Wound appears superficial. 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     Microbiology: Recent Results (from the past 240 hour(s))  Blood culture (routine x 2)     Status: None (Preliminary result)   Collection Time: 03/23/16  1:25 AM  Result Value Ref Range Status   Specimen Description BLOOD  LEFT ANTECUBITAL  Final   Special Requests BOTTLES DRAWN AEROBIC AND ANAEROBIC 5CC EACH  Final   Culture   Final    NO GROWTH 1 DAY [facility]    Report Status PENDING  Incomplete  Blood culture (routine x 2)     Status: None (Preliminary result)   Collection Time: 03/23/16  1:43 AM  Result Value Ref Range Status   Specimen Description BLOOD LEFT WRIST  Final   Special Requests BOTTLES DRAWN AEROBIC AND ANAEROBIC 5CC EACH  Final   Culture   Final    NO GROWTH 1 DAY Performed at [facility]    Report Status PENDING  Incomplete     Labs: BNP (last 3 results) No results for input(s): BNP in the last 8760 hours. Basic Metabolic Panel:  Recent Labs Lab 03/23/16 0126 03/23/16 1031 03/24/16 0343  NA 125*  --  135  K 3.7  --  4.1  CL 90*  --  100*  CO2 21*  --  27  GLUCOSE 115*  --  164*  BUN 7  --  11  CREATININE 0.68  --  0.60*  CALCIUM 9.4  --  9.3  MG  --  1.9  --   PHOS  --  4.1  --    Liver Function Tests: No results for input(s): AST, ALT, ALKPHOS, BILITOT, PROT, ALBUMIN in the last 168 hours. No results for input(s): LIPASE, AMYLASE in the last 168 hours. No results for input(s): AMMONIA in the last 168 hours. CBC:  Recent Labs Lab 03/23/16 0126 03/24/16 0343  WBC 16.2* 8.5  NEUTROABS 13.4*  --   HGB 15.5 14.3  HCT 42.9 41.5  MCV 84.3 85.7  PLT 240 227   Cardiac Enzymes: No results for input(s): CKTOTAL, CKMB, CKMBINDEX, TROPONINI in the last 168 hours. BNP: Invalid input(s): POCBNP CBG: No results for input(s): GLUCAP in the last 168 hours. D-Dimer No results for input(s): DDIMER in the last 72 hours. Hgb A1c No results for input(s): HGBA1C in the last 72 hours. Lipid Profile No results for input(s): CHOL, HDL, LDLCALC, TRIG, CHOLHDL, LDLDIRECT in the last 72 hours. Thyroid function studies  Recent Labs  03/23/16 1031  TSH 2.345   Anemia work up No results for input(s): VITAMINB12, FOLATE, FERRITIN, TIBC,  IRON, RETICCTPCT in the last 72 hours. Urinalysis    Component Value Date/Time   COLORURINE STRAW (A) 03/23/2016 1123   APPEARANCEUR CLEAR 03/23/2016 1123   LABSPEC 1.002 (L) 03/23/2016 1123   PHURINE 6.0 03/23/2016 1123   GLUCOSEU NEGATIVE 03/23/2016 1123   HGBUR MODERATE (A) 03/23/2016 1123   BILIRUBINUR NEGATIVE 03/23/2016 1123   KETONESUR NEGATIVE 03/23/2016 1123   PROTEINUR NEGATIVE 03/23/2016 1123   NITRITE NEGATIVE 03/23/2016 1123   LEUKOCYTESUR NEGATIVE 03/23/2016 1123   Sepsis Labs Invalid input(s): PROCALCITONIN,  WBC,  LACTICIDVEN Microbiology Recent Results (from the past 240 hour(s))  Blood culture (routine x 2)     Status: None (Preliminary result)   Collection Time: 03/23/16  1:25 AM  Result Value Ref Range Status   Specimen Description BLOOD LEFT ANTECUBITAL  Final   Special Requests BOTTLES DRAWN AEROBIC AND ANAEROBIC 5CC EACH  Final   Culture   Final    NO GROWTH 1 DAY Performed at [facility]    Report Status PENDING  Incomplete  Blood culture (routine x 2)     Status: None (Preliminary result)   Collection Time: 03/23/16  1:43 AM  Result Value Ref Range Status   Specimen Description BLOOD LEFT WRIST  Final   Special Requests BOTTLES DRAWN AEROBIC AND ANAEROBIC 5CC EACH  Final   Culture   Final    NO GROWTH 1 DAY Performed at [facility]    Report Status PENDING  Incomplete     Time coordinating discharge: Over 30 minutes  SIGNED:   Jacquelin Hawking, MD [facility] 03/24/2016, 3:11 PM Pager [phone]  If 7PM-7AM, please contact night-coverage [email or web address] Password [email or web address]

## 2016-03-24 NOTE — Discharge Instructions (Addendum)
Darrell Ritter   You have been treated for your gout flare in addition to a skin infection. You will go home with a steroid taper for your gout. Please start allopurinol after your symptoms have subsided and your prednisone taper has been completed. With regard to your skin infection, please take the doxycycline as directed. We discussed you seeing a primary care physician, and you have declined the case manager coming to see you as you have a plan for a PCP. It is imperative that you follow up with someone as an outpatient to ensure good ongoing management.    Cellulitis, Adult Introduction Cellulitis is a skin infection. The infected area is usually red and sore. This condition occurs most often in the arms and lower legs. It is very important to get treated for this condition. Follow these instructions at home:  Take over-the-counter and prescription medicines only as told by your doctor.  If you were prescribed an antibiotic medicine, take it as told by your doctor. Do not stop taking the antibiotic even if you start to feel better.  Drink enough fluid to keep your pee (urine) clear or pale yellow.  Do not touch or rub the infected area.  Raise (elevate) the infected area above the level of your heart while you are sitting or lying down.  Place warm or cold wet cloths (warm or cold compresses) on the infected area. Do this as told by your doctor.  Keep all follow-up visits as told by your doctor. This is important. These visits let your doctor make sure your infection is not getting worse. Contact a doctor if:  You have a fever.  Your symptoms do not get better after 1-2 days of treatment.  Your bone or joint under the infected area starts to hurt after the skin has healed.  Your infection comes back. This can happen in the same area or another area.  You have a swollen bump in the infected area.  You have new symptoms.  You feel ill and also have muscle aches and  pains. Get help right away if:  Your symptoms get worse.  You feel very sleepy.  You throw up (vomit) or have watery poop (diarrhea) for a long time.  There are red streaks coming from the infected area.  Your red area gets larger.  Your red area turns darker. This information is not intended to replace advice given to you by your health care provider. Make sure you discuss any questions you have with your health care provider. Document Released: 08/14/2007 Document Revised: 08/03/2015 Document Reviewed: 01/04/2015  2017 Elsevier     Gout Gout is painful swelling that can occur in some of your joints. Gout is a type of arthritis. This condition is caused by having too much uric acid in your body. Uric acid is a chemical that forms when your body breaks down substances called purines. Purines are important for building body proteins. When your body has too much uric acid, sharp crystals can form and build up inside your joints. This causes pain and swelling. Gout attacks can happen quickly and be very painful (acute gout). Over time, the attacks can affect more joints and become more frequent (chronic gout). Gout can also cause uric acid to build up under your skin and inside your kidneys. What are the causes? This condition is caused by too much uric acid in your blood. This can occur because:  Your kidneys do not remove enough uric acid from your  blood. This is the most common cause.  Your body makes too much uric acid. This can occur with some cancers and cancer treatments. It can also occur if your body is breaking down too many red blood cells (hemolytic anemia).  You eat too many foods that are high in purines. These foods include organ meats and some seafood. Alcohol, especially beer, is also high in purines. A gout attack may be triggered by trauma or stress. What increases the risk? This condition is more likely to develop in people who:  Have a family history of  gout.  Are male and middle-aged.  Are male and have gone through menopause.  Are obese.  Frequently drink alcohol, especially beer.  Are dehydrated.  Lose weight too quickly.  Have an organ transplant.  Have lead poisoning.  Take certain medicines, including aspirin, cyclosporine, diuretics, levodopa, and niacin.  Have kidney disease or psoriasis. What are the signs or symptoms? An attack of acute gout happens quickly. It usually occurs in just one joint. The most common place is the big toe. Attacks often start at night. Other joints that may be affected include joints of the feet, ankle, knee, fingers, wrist, or elbow. Symptoms may include:  Severe pain.  Warmth.  Swelling.  Stiffness.  Tenderness. The affected joint may be very painful to touch.  Shiny, red, or purple skin.  Chills and fever. Chronic gout may cause symptoms more frequently. More joints may be involved. You may also have white or yellow lumps (tophi) on your hands or feet or in other areas near your joints. How is this diagnosed? This condition is diagnosed based on your symptoms, medical history, and physical exam. You may have tests, such as:  Blood tests to measure uric acid levels.  Removal of joint fluid with a needle (aspiration) to look for uric acid crystals.  X-rays to look for joint damage. How is this treated? Treatment for this condition has two phases: treating an acute attack and preventing future attacks. Acute gout treatment may include medicines to reduce pain and swelling, including:  NSAIDs.  Steroids. These are strong anti-inflammatory medicines that can be taken by mouth (orally) or injected into a joint.  Colchicine. This medicine relieves pain and swelling when it is taken soon after an attack. It can be given orally or through an IV tube. Preventive treatment may include:  Daily use of smaller doses of NSAIDs or colchicine.  Use of a medicine that reduces uric acid  levels in your blood.  Changes to your diet. You may need to see a specialist about healthy eating (dietitian). Follow these instructions at home: During a Gout Attack  If directed, apply ice to the affected area:  Put ice in a plastic bag.  Place a towel between your skin and the bag.  Leave the ice on for 20 minutes, 2-3 times a day.  Rest the joint as much as possible. If the affected joint is in your leg, you may be given crutches to use.  Raise (elevate) the affected joint above the level of your heart as often as possible.  Drink enough fluids to keep your urine clear or pale yellow.  Take over-the-counter and prescription medicines only as told by your health care provider.  Do not drive or operate heavy machinery while taking prescription pain medicine.  Follow instructions from your health care provider about eating or drinking restrictions.  Return to your normal activities as told by your health care provider. Ask your  health care provider what activities are safe for you. Avoiding Future Gout Attacks  Follow a low-purine diet as told by your dietitian or health care provider. Avoid foods and drinks that are high in purines, including liver, kidney, anchovies, asparagus, herring, mushrooms, mussels, and beer.  Limit alcohol intake to no more than 1 drink a day for nonpregnant women and 2 drinks a day for men. One drink equals 12 oz of beer, 5 oz of wine, or 1 oz of hard liquor.  Maintain a healthy weight or lose weight if you are overweight. If you want to lose weight, talk with your health care provider. It is important that you do not lose weight too quickly.  Start or maintain an exercise program as told by your health care provider.  Drink enough fluids to keep your urine clear or pale yellow.  Take over-the-counter and prescription medicines only as told by your health care provider.  Keep all follow-up visits as told by your health care provider. This is  important. Contact a health care provider if:  You have another gout attack.  You continue to have symptoms of a gout attack after10 days of treatment.  You have side effects from your medicines.  You have chills or a fever.  You have burning pain when you urinate.  You have pain in your lower back or belly. Get help right away if:  You have severe or uncontrolled pain.  You cannot urinate. This information is not intended to replace advice given to you by your health care provider. Make sure you discuss any questions you have with your health care provider. Document Released: 02/23/2000 Document Revised: 08/03/2015 Document Reviewed: 12/08/2014 Elsevier Interactive Patient Education  2017 [employer].

## 2016-03-24 NOTE — Evaluation (Signed)
Physical Therapy Evaluation Patient Details Name: Darrell Ritter MRN: [id number] DOB: 1960-11-23 Today's Date: 03/24/2016   History of Present Illness  Darrell Ritter is a 56 y.o. male with PMH significant for tobacco abuse, HTN and gout; who presented to Ed complaining of significant swelling of his joint and decrease range of motion. Patient reports some subjective fever and chills at home.    Clinical Impression  Pt admitted with above diagnosis. Pt currently with functional limitations due to the deficits listed below (see PT Problem List).  Pt will benefit from skilled PT to increase their independence and safety with mobility to allow discharge to the venue listed below.  Pt should progress well during hospital stay. Pt very happy that he was able to ambulate 40' and reports he has not walked that far in weeks.  Discussed how to progress his mobility at home. No DME needs.     Follow Up Recommendations No PT follow up    Equipment Recommendations  None recommended by PT    Recommendations for Other Services       Precautions / Restrictions Precautions Precautions: None      Mobility  Bed Mobility               General bed mobility comments: sitting EOB upon arrival  Transfers Overall transfer level: Needs assistance Equipment used: Rolling walker (2 wheeled) Transfers: Sit to/from Stand Sit to Stand: Supervision         General transfer comment: S only with cueing for proper hand placement  Ambulation/Gait Ambulation/Gait assistance: Min guard;Supervision Ambulation Distance (Feet): 40 Feet Assistive device: Rolling walker (2 wheeled) Gait Pattern/deviations: Decreased step length - right;Decreased step length - left;Antalgic Gait velocity: decreased Gait velocity interpretation: Below normal speed for age/gender General Gait Details: heavy reliance on RW  Stairs            Wheelchair Mobility    Modified Rankin (Stroke Patients Only)        Balance Overall balance assessment: Needs assistance           Standing balance-Leahy Scale: Fair Standing balance comment: able to stand statically without AD, but needs RW for gait                             Pertinent Vitals/Pain Pain Assessment: 0-10 Pain Score: 3  Pain Location: L knee and L hip/low back Pain Descriptors / Indicators: Sore Pain Intervention(s): Premedicated before session;Limited activity within patient's tolerance;Monitored during session    Home Living Family/patient expects to be discharged to:: Private residence Living Arrangements: Children (daughter is not home much per pt) Available Help at Discharge: Family;Available PRN/intermittently Type of Home: House Home Access: Level entry     Home Layout: One level Home Equipment: Wheelchair - [employer] - 4 wheels;Shower seat;Bedside commode;Walker - 2 wheels;Hospital bed Additional Comments: girlfriend who assisted him, passed away a few months ago. DME was hers.    Prior Function Level of Independence: Independent with assistive device(s)         Comments: Since last month he has been using rollator and w/c cause of "gout pain" and not even ambulating last week.  2-3 months ago he was independent     Hand Dominance        Extremity/Trunk Assessment   Upper Extremity Assessment Upper Extremity Assessment: Overall WFL for tasks assessed    Lower Extremity Assessment Lower Extremity Assessment: LLE deficits/detail;RLE deficits/detail RLE Deficits /  Details: R foot wrapped LLE Deficits / Details: ROM limited due to pain and strength grossly 3-/5, but pt reports improvement LLE: Unable to fully assess due to pain       Communication   Communication: No difficulties  Cognition Arousal/Alertness: Awake/alert Behavior During Therapy: WFL for tasks assessed/performed Overall Cognitive Status: Within Functional Limits for tasks assessed                       General Comments General comments (skin integrity, edema, etc.): R foot ulcer, currently wrapped    Exercises     Assessment/Plan    PT Assessment Patient needs continued PT services  PT Problem List Decreased strength;Decreased range of motion;Decreased mobility;Decreased activity tolerance;Decreased knowledge of use of DME          PT Treatment Interventions DME instruction;Gait training;Functional mobility training;Therapeutic activities;Therapeutic exercise;Balance training    PT Goals (Current goals can be found in the Care Plan section)  Acute Rehab PT Goals Patient Stated Goal: go home and  return to PLOF PT Goal Formulation: With patient Time For Goal Achievement: 04/07/16 Potential to Achieve Goals: Good    Frequency Min 3X/week   Barriers to discharge        Co-evaluation               End of Session Equipment Utilized During Treatment: Gait belt Activity Tolerance: Patient tolerated treatment well Patient left: in bed;with call bell/phone within reach (sitting EOB) Nurse Communication: Mobility status         Time: [id number] PT Time Calculation (min) (ACUTE ONLY): 28 min   Charges:   PT Evaluation $PT Eval Low Complexity: 1 Procedure PT Treatments $Gait Training: 8-22 mins   PT G Codes:        Darrell Ritter 03/24/2016, 10:24 AM

## 2016-03-24 NOTE — Progress Notes (Signed)
Patient discharged to home with family, discharge instructions reviewed with patient who verbalized understanding. New RX's electronically sent to pharmacy. 

## 2016-03-28 LAB — CULTURE, BLOOD (ROUTINE X 2)
Culture: NO GROWTH
Culture: NO GROWTH

## 2016-05-08 ENCOUNTER — Ambulatory Visit (HOSPITAL_COMMUNITY)
Admission: EM | Admit: 2016-05-08 | Discharge: 2016-05-08 | Disposition: A | Discharge status: Home / Self Care | Payer: Self-pay | Attending: Family Medicine | Admitting: Family Medicine

## 2016-05-08 DIAGNOSIS — M109 Gout, unspecified: Secondary | ICD-10-CM

## 2016-05-08 DIAGNOSIS — M25561 Pain in right knee: Secondary | ICD-10-CM

## 2016-05-08 MED ORDER — COLCHICINE 0.6 MG PO TABS: ORAL_TABLET | ORAL | 0 refills | Status: DC

## 2016-05-08 MED ORDER — PREDNISONE 20 MG PO TABS: ORAL_TABLET | ORAL | 0 refills | Status: DC

## 2016-05-08 NOTE — Discharge Instructions (Signed)
Take the medication as directed. Obtain a primary care doctor soon as you are able. Be sure to take the medicine with food. Elevate your lower extremities for the swelling.

## 2016-05-08 NOTE — ED Provider Notes (Signed)
CSN: [id number]     Arrival date & time 05/08/16  1332 History   First MD Initiated Contact with Patient 05/08/16 1421     Chief Complaint  Patient presents with  . Knee Pain   (Consider location/radiation/quality/duration/timing/severity/associated sxs/prior Treatment) 56 year old male with a history of gallop presents to the urgent care with typical gouty symptoms of the right knee associated with pain and swelling. He states that he was in the emergency department last month for the same symptoms with a diagnosis of gout. He has gouty tophi of the right hand. He was treated with prednisone and allopurinol.      Past Medical History:  Diagnosis Date  . Gout   . Hypertension    No past surgical history on file. No family history on file. Social History  Substance Use Topics  . Smoking status: Current Every Day Smoker    Packs/day: 1.00    Types: Cigarettes  . Smokeless tobacco: Never Used  . Alcohol use Yes     Comment: occasional    Review of Systems  Constitutional: Negative.  Negative for fever.  Eyes: Negative.   Cardiovascular: Negative.   Genitourinary: Negative.   Musculoskeletal:       As per history of present illness.  Skin: Negative for color change.  Neurological: Negative.   All other systems reviewed and are negative.   Allergies  Patient has no known allergies.  Home Medications   Prior to Admission medications   Medication Sig Start Date End Date Taking? Authorizing Provider  allopurinol (ZYLOPRIM) 100 MG tablet Take 1 tablet (100 mg total) by mouth daily. 03/24/16   Narda Bonds, MD  colchicine 0.6 MG tablet 2 tabs po now, then  1 tab po daily for 4 days. Take with food. 05/08/16   Hayden Rasmussen, NP  predniSONE (DELTASONE) 20 MG tablet Take 3 tabs po on first and second day. , 2 tabs third and fourth and 5th day, 1 tab the 6th day Take with food. 05/08/16   Hayden Rasmussen, NP   Meds Ordered and Administered this Visit  Medications - No data to  display  BP 170/90 (BP Location: Right Arm)   Pulse 94   Temp 98.7 F (37.1 C) (Oral)   Resp 16   SpO2 100%  No data found.   Physical Exam  Constitutional: He is oriented to person, place, and time. He appears well-developed and well-nourished. No distress.  HENT:  Head: Normocephalic and atraumatic.  Eyes: EOM are normal.  Neck: Neck supple.  Cardiovascular: Normal rate.   Pulmonary/Chest: Effort normal.  Musculoskeletal: He exhibits edema and tenderness.  Right knee with generalized swelling and tenderness and increased warmth. No erythema no lymphangitis no signs of cellulitis. There is 1+ edema to the right lower extremity below the knee. Note that the patient states he always has lower extremity edema whenever he gets Out in his knees. This has occurred frequently before. He has a large cluster of tophi to the dorsum of the right hand. No tenderness or pain to the right lower extremity. No calf tenderness or pain.  Neurological: He is alert and oriented to person, place, and time.  Skin: Skin is warm and dry.  Psychiatric: He has a normal mood and affect.  Nursing note and vitals reviewed.   Urgent Care Course     Procedures (including critical care time)  Labs Review Labs Reviewed - No data to display  Imaging Review No results found.   Visual Acuity  Review  Right Eye Distance:   Left Eye Distance:   Bilateral Distance:    Right Eye Near:   Left Eye Near:    Bilateral Near:         MDM   1. Acute gout of right knee, unspecified cause   2. Acute pain of right knee    Take the medication as directed. Obtain a primary care doctor soon as you are able. Be sure to take the medicine with food. Elevate your lower extremities for the swelling. Meds ordered this encounter  Medications  . predniSONE (DELTASONE) 20 MG tablet    Sig: Take 3 tabs po on first and second day. , 2 tabs third and fourth and 5th day, 1 tab the 6th day Take with food.    Dispense:   13 tablet    Refill:  0    Order Specific Question:   Supervising Provider    Answer:   Linna HoffKINDL, JAMES D [phone][[id number]]  . colchicine 0.6 MG tablet    Sig: 2 tabs po now, then  1 tab po daily for 4 days. Take with food.    Dispense:  6 tablet    Refill:  0    Order Specific Question:   Supervising Provider    Answer:   Linna HoffKINDL, JAMES D [[id number]]       Hayden Rasmussenavid Kalii Chesmore, NP 05/08/16 [phone]

## 2016-05-08 NOTE — ED Triage Notes (Signed)
C/o right knee pain States he does get gout in the knee

## 2018-02-11 ENCOUNTER — Ambulatory Visit (HOSPITAL_COMMUNITY)
Admission: EM | Admit: 2018-02-11 | Discharge: 2018-02-11 | Disposition: A | Discharge status: Home / Self Care | Payer: Self-pay | Attending: Family Medicine | Admitting: Family Medicine

## 2018-02-11 ENCOUNTER — Encounter (HOSPITAL_COMMUNITY): Payer: Self-pay

## 2018-02-11 DIAGNOSIS — M109 Gout, unspecified: Secondary | ICD-10-CM

## 2018-02-11 MED ORDER — PREDNISONE 10 MG (21) PO TBPK: ORAL_TABLET | Freq: Every day | ORAL | 0 refills | Status: DC

## 2018-02-11 MED ORDER — INDOMETHACIN 50 MG PO CAPS: 50.0000 mg | ORAL_CAPSULE | Freq: Two times a day (BID) | ORAL | 0 refills | Status: DC

## 2018-02-11 NOTE — Discharge Instructions (Signed)
Please begin prednisone taper with food- begin with 6 tabs today and decrease by 1 tablet each day until you take 1 tablet on day 6  May also use indomethacin twice daily with food on stomach  Follow up if symptoms not resolving, redness spreading up arm, numbness, tingling, fever

## 2018-02-11 NOTE — ED Triage Notes (Signed)
Pt presents with left hand pain and swelling from gout flare up.

## 2018-02-11 NOTE — ED Provider Notes (Signed)
[facility]    CSN: [id number] Arrival date & time: 02/11/18  1222     History   Chief Complaint Chief Complaint  Patient presents with  . GOUT  - Hand Swelling/Pain    HPI Darrell Ritter is a 57 y.o. male history of hypertension, gout, tobacco use presenting today for evaluation of a gout flare.  Patient states that over the past week he has had worsening pain, redness and swelling to his left hand.  Symptoms began to the base of the left second finger and have spread since then.  States that he has dealt with gout for the past 20 years and is pretty confident that this is the cause of the symptoms.  He is tried some BenGay cream and other topical medicines without relief.  He is previously done well with prednisone, concerned about colchicine as it has been expensive in the past.  States that he had good relief with a medicine that began with an "I".  HPI  Past Medical History:  Diagnosis Date  . Gout   . Hypertension     Patient Active Problem List   Diagnosis Date Noted  . Foot ulcer, right (HCC) 03/23/2016  . Leukocytosis 03/23/2016  . Acute idiopathic gout 03/23/2016  . HTN (hypertension) 03/23/2016  . Tobacco abuse 03/23/2016  . Hyponatremia 03/23/2016  . Cellulitis 03/23/2016  . Cellulitis of foot without toes, right 03/23/2016    History reviewed. No pertinent surgical history.     Home Medications    Prior to Admission medications   Medication Sig Start Date End Date Taking? Authorizing Provider  indomethacin (INDOCIN) 50 MG capsule Take 1 capsule (50 mg total) by mouth 2 (two) times daily with a meal. 02/11/18   Fishel Wamble C, PA-C  predniSONE (STERAPRED UNI-PAK 21 TAB) 10 MG (21) TBPK tablet Take by mouth daily. 6 tablets on day 1, 5 tablets on day 2, 4 on day 3, 3 on day 4, 2 on day 5, 1 on day 6 02/11/18   Chawn Spraggins, MadisonHallie C, PA-C    Family History History reviewed. No pertinent family history.  Social History Social History    Tobacco Use  . Smoking status: Current Every Day Smoker    Packs/day: 1.00    Types: Cigarettes  . Smokeless tobacco: Never Used  Substance Use Topics  . Alcohol use: Yes    Comment: occasional  . Drug use: No     Allergies   Patient has no known allergies.   Review of Systems Review of Systems  Constitutional: Negative for fatigue and fever.  Eyes: Negative for redness, itching and visual disturbance.  Respiratory: Negative for shortness of breath.   Cardiovascular: Negative for chest pain and leg swelling.  Gastrointestinal: Negative for nausea and vomiting.  Musculoskeletal: Positive for arthralgias, joint swelling and myalgias.  Skin: Positive for color change. Negative for rash and wound.  Neurological: Negative for dizziness, syncope, weakness, light-headedness and headaches.     Physical Exam Triage Vital Signs ED Triage Vitals [02/11/18 1340]  Enc Vitals Group     BP (!) 178/93     Pulse Rate 94     Resp 20     Temp 98 F (36.7 C)     Temp Source Oral     SpO2 100 %     Weight      Height      Head Circumference      Peak Flow      Pain Score  7     Pain Loc      Pain Edu?      Excl. in GC?    No data found.  Updated Vital Signs BP (!) 178/93 (BP Location: Right Arm)   Pulse 94   Temp 98 F (36.7 C) (Oral)   Resp 20   SpO2 100%   Visual Acuity Right Eye Distance:   Left Eye Distance:   Bilateral Distance:    Right Eye Near:   Left Eye Near:    Bilateral Near:     Physical Exam  Constitutional: He is oriented to person, place, and time. He appears well-developed and well-nourished.  No acute distress  HENT:  Head: Normocephalic and atraumatic.  Nose: Nose normal.  Eyes: Conjunctivae are normal.  Neck: Neck supple.  Cardiovascular: Normal rate.  Pulmonary/Chest: Effort normal. No respiratory distress.  Abdominal: He exhibits no distension.  Musculoskeletal: Normal range of motion.  Right hand with significant swelling and  redness throughout hand and fingers, increased swelling around base of second finger/distal second metacarpal  Radial pulse 2+  Neurological: He is alert and oriented to person, place, and time.  Skin: Skin is warm and dry.  Psychiatric: He has a normal mood and affect.  Nursing note and vitals reviewed.    UC Treatments / Results  Labs (all labs ordered are listed, but only abnormal results are displayed) Labs Reviewed - No data to display  EKG None  Radiology No results found.  Procedures Procedures (including critical care time)  Medications Ordered in UC Medications - No data to display  Initial Impression / Assessment and Plan / UC Course  I have reviewed the triage vital signs and the nursing notes.  Pertinent labs & imaging results that were available during my care of the patient were reviewed by me and considered in my medical decision making (see chart for details).     Patient likely with gout flare causing symptoms, also discussed possibility of cellulitis but given his history will treat for gout.  Will begin prednisone taper, will provide prescription for indomethacin in hopes that this was the medicine that began with an 'I' that he had relief with previously.  Will have patient continue to monitor redness swelling and temperature, follow-up if symptoms not resolving with provided treatment or worsening.Discussed strict return precautions. Patient verbalized understanding and is agreeable with plan.  Final Clinical Impressions(s) / UC Diagnoses   Final diagnoses:  Acute gout of left hand, unspecified cause     Discharge Instructions     Please begin prednisone taper with food- begin with 6 tabs today and decrease by 1 tablet each day until you take 1 tablet on day 6  May also use indomethacin twice daily with food on stomach  Follow up if symptoms not resolving, redness spreading up arm, numbness, tingling, fever   ED Prescriptions    Medication Sig  Dispense Auth. Provider   predniSONE (STERAPRED UNI-PAK 21 TAB) 10 MG (21) TBPK tablet Take by mouth daily. 6 tablets on day 1, 5 tablets on day 2, 4 on day 3, 3 on day 4, 2 on day 5, 1 on day 6 21 tablet Nalee Lightle C, PA-C   indomethacin (INDOCIN) 50 MG capsule Take 1 capsule (50 mg total) by mouth 2 (two) times daily with a meal. 30 capsule Manasi Dishon C, PA-C     Controlled Substance Prescriptions [place] Controlled Substance Registry consulted? Not Applicable   Lew Dawes, [place] 02/11/18 1446

## 2018-02-13 IMAGING — MR MR FOOT*R* WO/W CM
4 of 9 series · 18 of 40 positions shown · IV contrast (multihance)
Comparison: 03/23/2016

CLINICAL DATA: Pain between the first and second toes. Gout.
Blistering. Unstable gait. Ulcer with purulent discharge.

EXAM:
MRI OF THE RIGHT FOREFOOT WITHOUT AND WITH CONTRAST
TECHNIQUE: Multiplanar, multisequence MR imaging of the right forefoot was
performed before and after the administration of intravenous
contrast.
CONTRAST:  19mL MULTIHANCE GADOBENATE DIMEGLUMINE 529 MG/ML IV SOLN

[Series 4: T1 · coronal · 4.0mm · 0.29mm/px · 7 of 45 slices shown (1 of 2)]
[im 1/45]
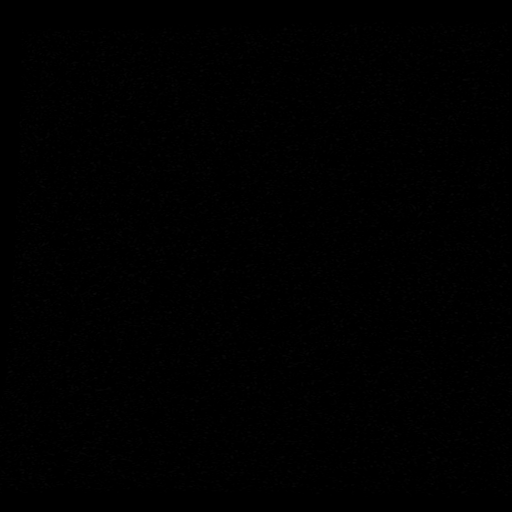
[im 8/45]
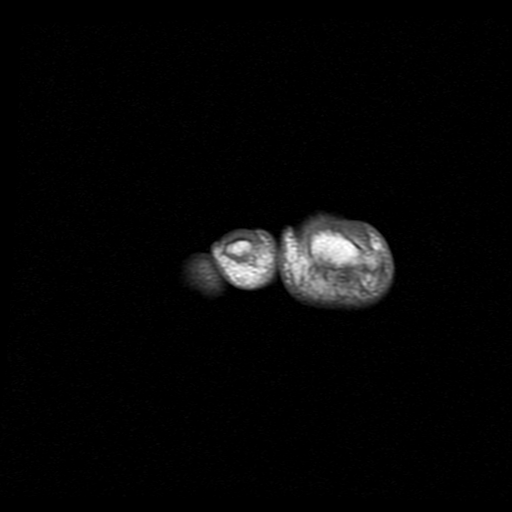
[im 15/45]
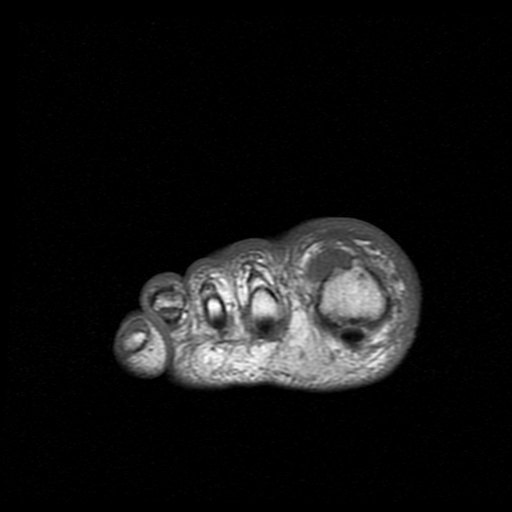
[im 23/45]
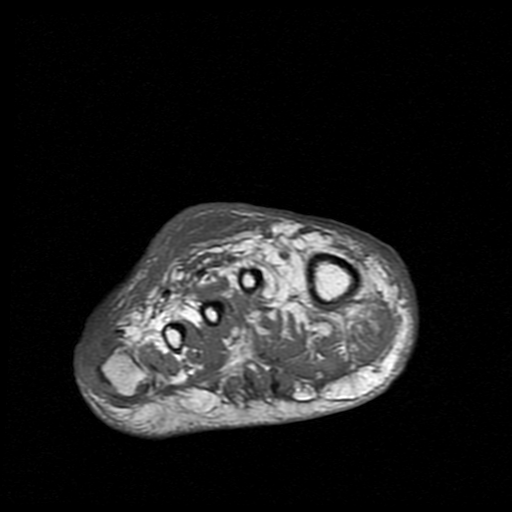
[im 30/45]
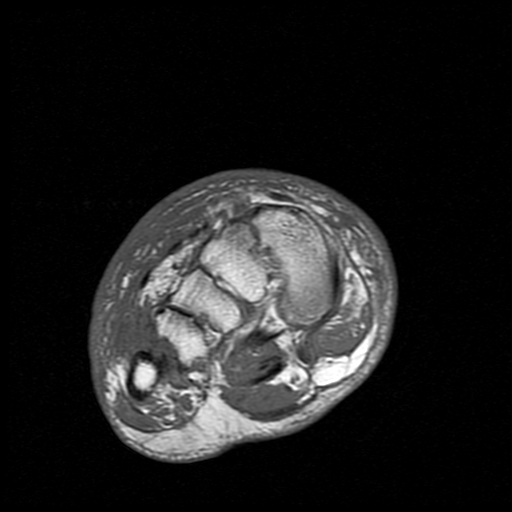
[im 37/45]
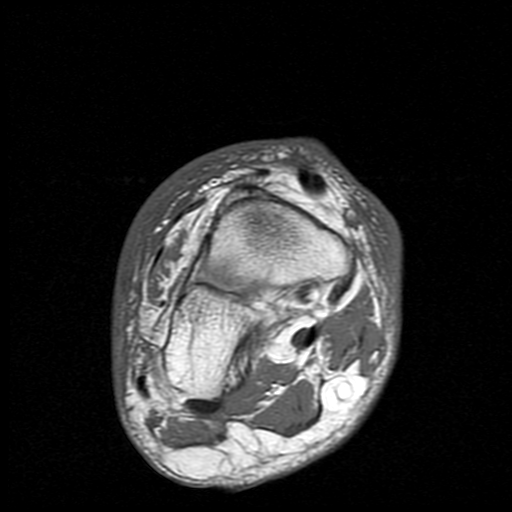
[im 45/45]
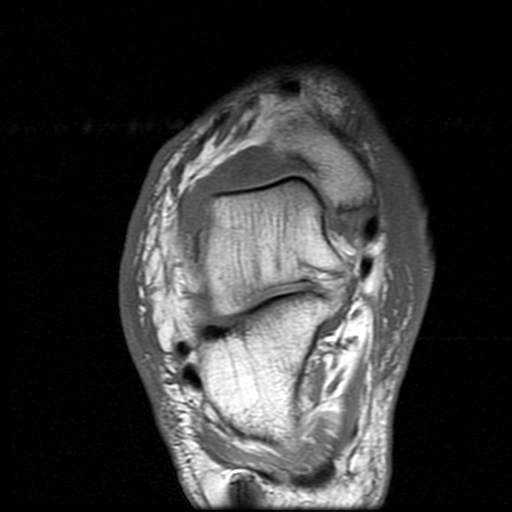

[Series 7: T1 · oblique · 4.0mm · 0.35mm/px · 3 of 22 slices shown (2 of 2)]
[im 1/22]
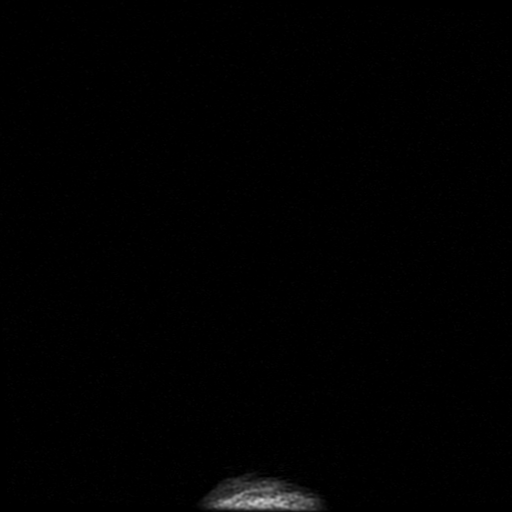
[im 11/22]
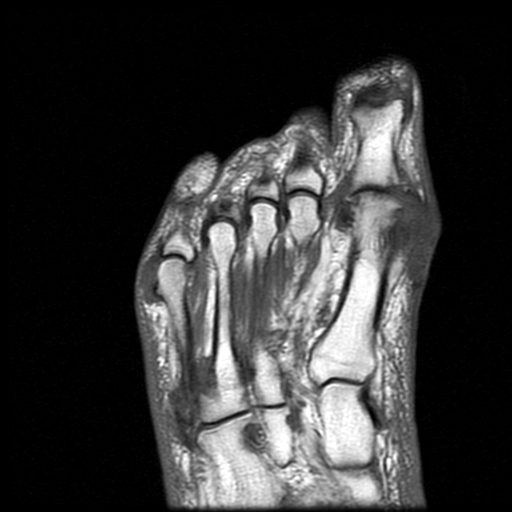
[im 22/22]
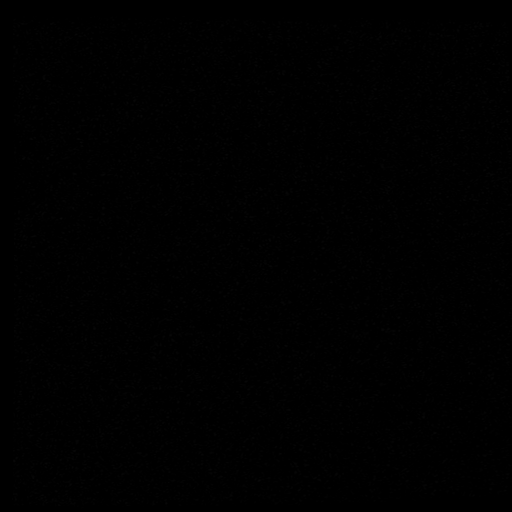

[Series 10: T1 fat-sat post-contrast · coronal · 4.0mm · 0.29mm/px · 5 of 45 slices shown]
[im 1/45]
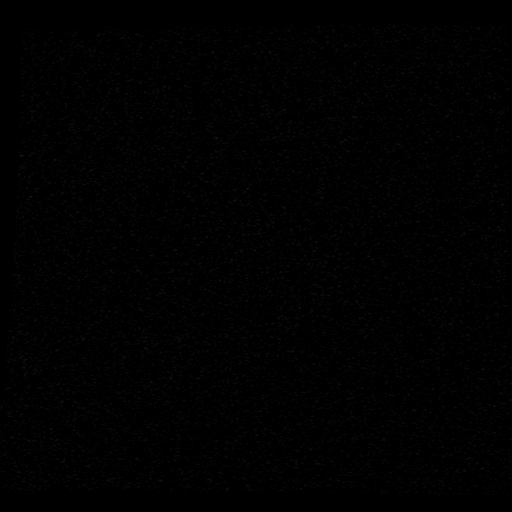
[im 9/45]
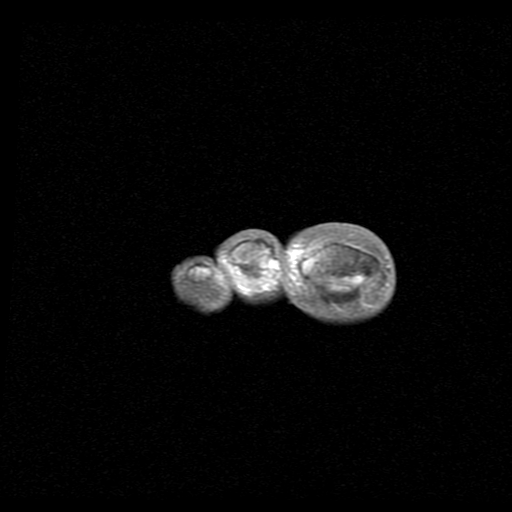
[im 18/45]
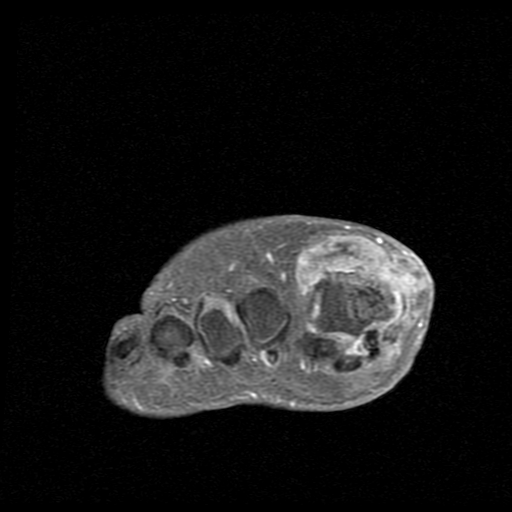
[im 27/45]
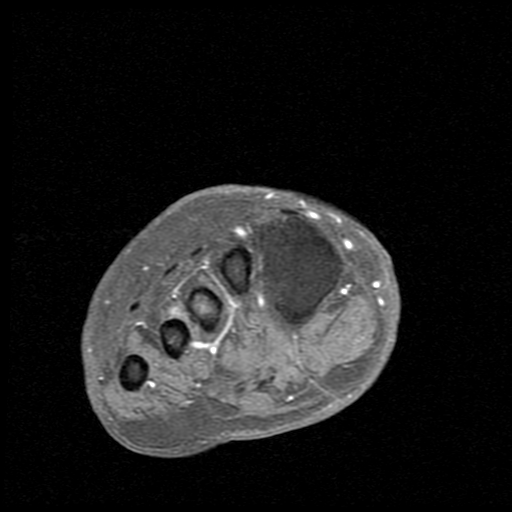
[im 45/45]
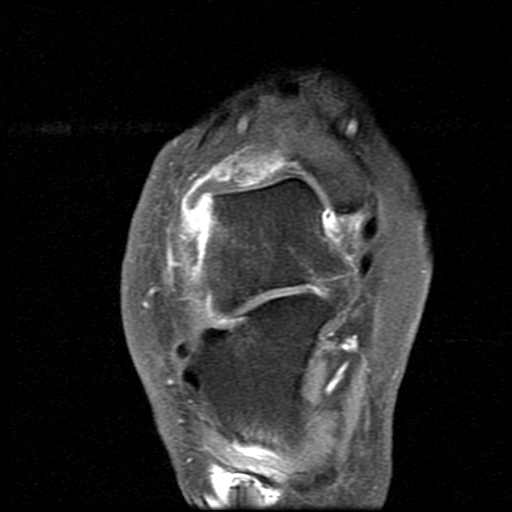

[Series 11: T1 post-contrast · oblique · 4.0mm · 0.35mm/px · 3 of 22 slices shown]
[im 1/22]
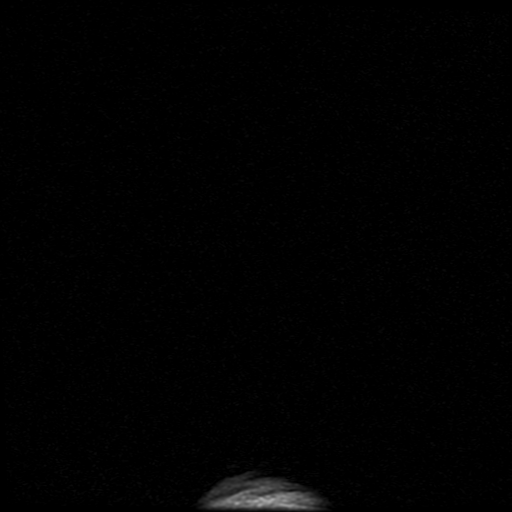
[im 11/22]
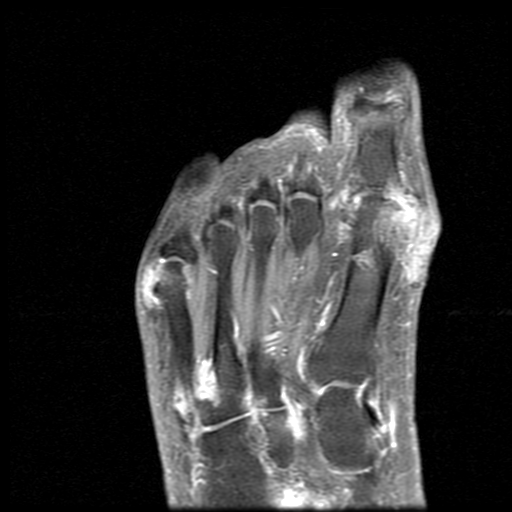
[im 22/22]
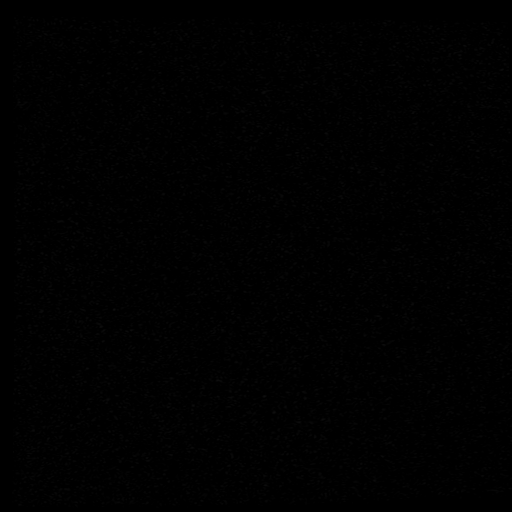

[18 of 40 positions shown; findings below may reference images not displayed]

FINDINGS: Bones/Joint/Cartilage

Extensive synovitis and erosive arthropathy at the first MTP joint
more characteristic of gout than infection. The is a 1.5 cm thick
rind of enhancing tissue extending around the head of the first
metatarsal and joint associated with bony erosion and potential
destruction of the medial sesamoid and large bony erosions in the
head of the first metatarsal.

There is a similar but smaller soft tissue mass associated with the
fifth MTP joint lateral to the head of the fifth metatarsal with
mild erosion of the head of the fifth metatarsal, likewise
characteristic of gout.

There is a third soft tissue mass extending along the fourth
intermetatarsal joint and above the base of the fifth metatarsal
with associated erosion of the lateral base of the fourth
metatarsal.

Image 12 series 11 there is a 9 by 5 mm focus of abnormal
enhancement and edema in the proximal metadiaphyseal medullary space
of the third metatarsal, technically nonspecific but possibly a
localize stress fracture. I am skeptical that this represents a
focal osteomyelitis.

Ligaments

Lisfranc ligament intact.

Muscles and Tendons

Edema tracks along the plantar musculotendinous structures of the
foot in a diffuse fashion. Similar low-grade edema in the extensor
digitorum brevis muscle.

Soft tissues

Diffuse subcutaneous edema along the dorsum of the foot without
observed enhancement. Edema extends into the toes especially the
first through third toes.
IMPRESSION: 1. Enhancing periarticular soft tissue masses associated with the
first MTP joint, fifth MTP joint, and fourth intermetatarsal joint
with associated erosions, characteristic for gout. There is severe
erosion and essentially destruction of the first digit medial
sesamoid. Strictly speaking it is difficult to totally exclude
superinfection particularly at the first MTP joint, but the findings
are thought to be reasonably accounted for by the diagnosis of gout.
2. Diffuse subcutaneous edema in the foot. Although not discernibly
enhancing, this could conceivably represent cellulitis.
3. There is also low-grade edema tracking within along the
musculotendinous structures of the forefoot.
4. No drainable abscess.

## 2022-08-13 ENCOUNTER — Ambulatory Visit (HOSPITAL_COMMUNITY)
Admission: EM | Admit: 2022-08-13 | Discharge: 2022-08-13 | Disposition: A | Discharge status: Home / Self Care | Payer: Medicaid Other | Attending: Internal Medicine | Admitting: Internal Medicine

## 2022-08-13 ENCOUNTER — Encounter (HOSPITAL_COMMUNITY): Payer: Self-pay

## 2022-08-13 DIAGNOSIS — M1711 Unilateral primary osteoarthritis, right knee: Secondary | ICD-10-CM

## 2022-08-13 DIAGNOSIS — M5431 Sciatica, right side: Secondary | ICD-10-CM

## 2022-08-13 DIAGNOSIS — I10 Essential (primary) hypertension: Secondary | ICD-10-CM

## 2022-08-13 LAB — CBC
HCT: 47.5 % (ref 39.0–52.0)
Hemoglobin: 16.5 g/dL (ref 13.0–17.0)
MCH: 32.2 pg (ref 26.0–34.0)
MCHC: 34.7 g/dL (ref 30.0–36.0)
MCV: 92.6 fL (ref 80.0–100.0)
Platelets: 133 10*3/uL — ABNORMAL LOW (ref 150–400)
RBC: 5.13 MIL/uL (ref 4.22–5.81)
RDW: 12.8 % (ref 11.5–15.5)
WBC: 4.7 10*3/uL (ref 4.0–10.5)
nRBC: 0 % (ref 0.0–0.2)

## 2022-08-13 LAB — BASIC METABOLIC PANEL
Anion gap: 13 (ref 5–15)
BUN: 10 mg/dL (ref 6–20)
CO2: 25 mmol/L (ref 22–32)
Calcium: 9.4 mg/dL (ref 8.9–10.3)
Chloride: 96 mmol/L — ABNORMAL LOW (ref 98–111)
Creatinine, Ser: 0.84 mg/dL (ref 0.61–1.24)
GFR, Estimated: >60 mL/min (ref >60)
Glucose, Bld: 107 mg/dL — ABNORMAL HIGH (ref 70–99)
Potassium: 4.3 mmol/L (ref 3.5–5.1)
Sodium: 134 mmol/L — ABNORMAL LOW (ref 135–145)

## 2022-08-13 MED ORDER — TIZANIDINE HCL 4 MG PO TABS: 4.0000 mg | ORAL_TABLET | Freq: Four times a day (QID) | ORAL | 0 refills | Status: DC | PRN

## 2022-08-13 MED ORDER — AMLODIPINE BESYLATE 5 MG PO TABS: 5.0000 mg | ORAL_TABLET | Freq: Every day | ORAL | 2 refills | Status: AC

## 2022-08-13 MED ORDER — METHYLPREDNISOLONE 4 MG PO TBPK: ORAL_TABLET | ORAL | 0 refills | Status: DC

## 2022-08-13 MED ORDER — BLOOD PRESSURE CUFF MISC: 0 refills | Status: AC

## 2022-08-13 NOTE — ED Provider Notes (Signed)
[facility]    CSN: [id number] Arrival date & time: 08/13/22  1017      History   Chief Complaint Chief Complaint  Patient presents with   Gout    HPI Darrell Ritter is a 62 y.o. male.   Patient with history of hypertension, arthritis, gout, tobacco abuse, and sciatic nerve pain presents to urgent care for evaluation of right hip and right knee pain that he suspects is related to gout flare for the last 1 to 2 weeks.  History of acute gouty arthritis to multiple joints in the body.  Last gout flare was 3 to 4 months ago.  No recent trauma or injury to the right hip or the right knee. Pain to the right hip radiates down the back of the leg to the right knee. Pain is worse in the mornings when he first gets up and out of bed and improves throughout the day. Pain is worse with weightbearing and ambulation. Denies low back pain, recent falls/injuries, numbness/tingling to the lower extremities, saddle anesthesia symptoms, urinary symptoms, abdominal pain, rash, and extremity weakness. States the right knee is swollen but is not red or warm. Last acute gout flare was 3-4 months ago. No recent antibiotics/steroid use in the last 3 months. He does not take any medications for gout flare prevention. Denies recent intake of high purine foods. He has been taking ibuprofen over the counter to help with symptoms without relief.   BP elevated at 192/97 today and on chart review with past visits. Patient recently gained insurance through [place] but went for a long period of time without insurance and medical care. He used to take antihypertensive medication but has not had this in "years". He does not take his BP at home but reports he believes he has "White Coat Syndrome". He went to the dentist a few weeks ago and was told his BP is "fine". Denies chest pain, shortness of breath, dizziness, headache, vision changes, and weakness. He is a current everyday cigarette smoker, denies other drug  use.      Past Medical History:  Diagnosis Date   Gout    Hypertension     Patient Active Problem List   Diagnosis Date Noted   Foot ulcer, right (HCC) 03/23/2016   Leukocytosis 03/23/2016   Acute idiopathic gout 03/23/2016   HTN (hypertension) 03/23/2016   Tobacco abuse 03/23/2016   Hyponatremia 03/23/2016   Cellulitis 03/23/2016   Cellulitis of foot without toes, right 03/23/2016    History reviewed. No pertinent surgical history.     Home Medications    Prior to Admission medications   Medication Sig Start Date End Date Taking? Authorizing Provider  amLODipine (NORVASC) 5 MG tablet Take 1 tablet (5 mg total) by mouth daily. 08/13/22  Yes Carlisle Beers, FNP  Blood Pressure Monitoring (BLOOD PRESSURE CUFF) MISC Take BP 3-4 times per week and write down numbers. 08/13/22  Yes Carlisle Beers, FNP  methylPREDNISolone (MEDROL DOSEPAK) 4 MG TBPK tablet Take as directed with food. 08/13/22  Yes Carlisle Beers, FNP  tiZANidine (ZANAFLEX) 4 MG tablet Take 1 tablet (4 mg total) by mouth every 6 (six) hours as needed for muscle spasms. 08/13/22  Yes Carlisle Beers, FNP    Family History History reviewed. No pertinent family history.  Social History Social History   Tobacco Use   Smoking status: Every Day    Types: Cigars   Smokeless tobacco: Never  Vaping Use   Vaping  Use: Never used  Substance Use Topics   Alcohol use: Yes    Comment: occasional   Drug use: No     Allergies   Patient has no known allergies.   Review of Systems Review of Systems Per HPI  Physical Exam Triage Vital Signs ED Triage Vitals  Enc Vitals Group     BP 08/13/22 1050 (!) 192/97     Pulse Rate 08/13/22 1050 97     Resp 08/13/22 1050 16     Temp 08/13/22 1050 98 F (36.7 C)     Temp Source 08/13/22 1050 Oral     SpO2 08/13/22 1050 98 %     Weight --      Height --      Head Circumference --      Peak Flow --      Pain Score 08/13/22 1054 6     Pain Loc  --      Pain Edu? --      Excl. in GC? --    No data found.  Updated Vital Signs BP (!) 192/97 (BP Location: Left Arm)   Pulse 97   Temp 98 F (36.7 C) (Oral)   Resp 16   SpO2 98%   Visual Acuity Right Eye Distance:   Left Eye Distance:   Bilateral Distance:    Right Eye Near:   Left Eye Near:    Bilateral Near:     Physical Exam Vitals and nursing note reviewed.  Constitutional:      Appearance: He is not ill-appearing or toxic-appearing.  HENT:     Head: Normocephalic and atraumatic.     Right Ear: Hearing and external ear normal.     Left Ear: Hearing and external ear normal.     Nose: Nose normal.     Mouth/Throat:     Lips: Pink.  Eyes:     General: Lids are normal. Vision grossly intact. Gaze aligned appropriately.     Extraocular Movements: Extraocular movements intact.     Conjunctiva/sclera: Conjunctivae normal.  Pulmonary:     Effort: Pulmonary effort is normal.  Musculoskeletal:     Cervical back: Normal and neck supple.     Thoracic back: Normal.     Lumbar back: Normal.     Right hip: Normal. No tenderness (no tenderness elicited to palpation of right hip) or bony tenderness. Normal strength.     Left hip: Normal.     Right upper leg: Normal.     Left upper leg: Normal.     Right knee: Swelling present. No deformity, effusion, erythema, ecchymosis, lacerations, bony tenderness or crepitus. Normal range of motion. Tenderness present over the medial joint line, lateral joint line and patellar tendon. Normal alignment, normal meniscus and normal patellar mobility. Normal pulse.     Left knee: Normal.     Comments: No warmth or erythema to the right knee. Minimal swelling.   Skin:    General: Skin is warm and dry.     Capillary Refill: Capillary refill takes less than 2 seconds.     Findings: No rash.  Neurological:     General: No focal deficit present.     Mental Status: He is alert and oriented to person, place, and time. Mental status is at  baseline.     Cranial Nerves: Cranial nerves 2-12 are intact. No dysarthria or facial asymmetry.     Sensory: Sensation is intact.     Motor: Motor function is intact.  Coordination: Coordination is intact.     Gait: Gait is intact.     Comments: Strength and sensation intact to bilateral upper and lower extremities (5/5). Moves all 4 extremities with normal coordination voluntarily. Non-focal neuro exam.   Psychiatric:        Mood and Affect: Mood normal.        Speech: Speech normal.        Behavior: Behavior normal.        Thought Content: Thought content normal.        Judgment: Judgment normal.      UC Treatments / Results  Labs (all labs ordered are listed, but only abnormal results are displayed) Labs Reviewed  CBC  BASIC METABOLIC PANEL    EKG   Radiology No results found.  Procedures Procedures (including critical care time)  Medications Ordered in UC Medications - No data to display  Initial Impression / Assessment and Plan / UC Course  I have reviewed the triage vital signs and the nursing notes.  Pertinent labs & imaging results that were available during my care of the patient were reviewed by me and considered in my medical decision making (see chart for details).   1. Sciatic nerve pain, osteoarthritis of right knee Presentation consistent with acute inflammation and pain related to sciatic nerve irritation and osteoarthritis. Low suspicion for acute gouty arthritis etiology. Symptoms have not responded well to as needed use of NSAID prior to arrival, therefore will treat acute pain and inflammation with methylprednisolone dose pak as prescribed. Advised to avoid taking NSAIDs while taking steroids. Heat and  gentle ROM exercises recommended. Tizanidine muscle relaxer as needed for muscle spasm, drowsiness precautions dicussed. Deferred imaging based on stable MSK findings in clinic and hemodynamically stable vital signs in clinic. He is agreeable with  plan. May follow-up with PCP. PCP assistance initiated today.  2. Essential hypertension BP elevated today. Amlodipine 5mg  daily sent to pharmacy to be taken as prescribed.  Discussed risks of sustained elevated blood pressure without intervention.  Also discussed DASH diet as well as increasing exercise and attempt to naturally reduce blood pressure.  Advised to schedule an appointment with PCP for ongoing management of hypertension.  PCP assistance initiated today. No red flag signs/symptoms indicating need for referral to ED currently as patient is neurologically intact to baseline.   Discussed physical exam and available lab work findings in clinic with patient.  Counseled patient regarding appropriate use of medications and potential side effects for all medications recommended or prescribed today. Discussed red flag signs and symptoms of worsening condition,when to call the PCP office, return to urgent care, and when to seek higher level of care in the emergency department. Patient verbalizes understanding and agreement with plan. All questions answered. Patient discharged in stable condition.    Final Clinical Impressions(s) / UC Diagnoses   Final diagnoses:  Sciatic nerve pain, right  Osteoarthritis of right knee, unspecified osteoarthritis type  Essential hypertension     Discharge Instructions      Your symptoms are likely due to your sciatic nerve pain/arthritis versus gout. Regardless, we will treat this the same.   Take methylprednisolone dosepak as prescribed with food. Do not take ibuprofen when taking this medication.   Use heat and gentle exercises to the right leg/right hip to reduce pain.  You may take tizanidine muscle relaxer every 6 hours as needed for muscle spasms, however do not take this and drive, drink alcohol, or go to work as it  can make you very sleepy.  Your blood pressure was very elevated in the clinic today.   Start taking amlodipine blood pressure  medicine 5 mg once daily to help gradually lower your blood pressure. Take your blood pressure once daily for the next couple of weeks while your blood pressure medicine kicks in and write these numbers down in a notebook.  After 2 weeks, you may take your blood pressure 3-4 times a week.  Bring the notebook to your primary care provider visit so that they are able to see what your blood pressures have been at home and make medical decisions based off of this data.   Lower the amount of salt in your diet to less than 1 gram of salt per day and increase exercise to naturally lower BP. Review information provided regarding high blood pressure.  Schedule an appointment with primary care provider for ongoing management of high blood pressure and for routine healthcare screenings.  Please go to the ER if you develop any severe symptoms such as chest pain, sudden shortness of breath, or one-sided weakness. I hope you feel better!       ED Prescriptions     Medication Sig Dispense Auth. Provider   amLODipine (NORVASC) 5 MG tablet Take 1 tablet (5 mg total) by mouth daily. 30 tablet Reita May M, FNP   methylPREDNISolone (MEDROL DOSEPAK) 4 MG TBPK tablet Take as directed with food. 1 each Carlisle Beers, FNP   tiZANidine (ZANAFLEX) 4 MG tablet Take 1 tablet (4 mg total) by mouth every 6 (six) hours as needed for muscle spasms. 30 tablet Reita May M, [place]   Blood Pressure Monitoring (BLOOD PRESSURE CUFF) MISC Take BP 3-4 times per week and write down numbers. 1 each Carlisle Beers, FNP      PDMP not reviewed this encounter.   Carlisle Beers, [place] 08/13/22 1156

## 2022-08-13 NOTE — ED Triage Notes (Signed)
Patient c/o gout in his right knee and right hip x 1 1/2 weeks.  Patient also c/o "arthritis cysts in his right hand left elbow.

## 2022-08-13 NOTE — Discharge Instructions (Addendum)
Your symptoms are likely due to your sciatic nerve pain/arthritis versus gout. Regardless, we will treat this the same.   Take methylprednisolone dosepak as prescribed with food. Do not take ibuprofen when taking this medication.   Use heat and gentle exercises to the right leg/right hip to reduce pain.  You may take tizanidine muscle relaxer every 6 hours as needed for muscle spasms, however do not take this and drive, drink alcohol, or go to work as it can make you very sleepy.  Your blood pressure was very elevated in the clinic today.   Start taking amlodipine blood pressure medicine 5 mg once daily to help gradually lower your blood pressure. Take your blood pressure once daily for the next couple of weeks while your blood pressure medicine kicks in and write these numbers down in a notebook.  After 2 weeks, you may take your blood pressure 3-4 times a week.  Bring the notebook to your primary care provider visit so that they are able to see what your blood pressures have been at home and make medical decisions based off of this data.   Lower the amount of salt in your diet to less than 1 gram of salt per day and increase exercise to naturally lower BP. Review information provided regarding high blood pressure.  Schedule an appointment with primary care provider for ongoing management of high blood pressure and for routine healthcare screenings.  Please go to the ER if you develop any severe symptoms such as chest pain, sudden shortness of breath, or one-sided weakness. I hope you feel better!

## 2022-09-16 ENCOUNTER — Ambulatory Visit (HOSPITAL_COMMUNITY)
Admission: EM | Admit: 2022-09-16 | Discharge: 2022-09-16 | Disposition: A | Discharge status: Home / Self Care | Payer: Medicaid Other | Attending: Internal Medicine | Admitting: Internal Medicine

## 2022-09-16 ENCOUNTER — Encounter (HOSPITAL_COMMUNITY): Payer: Self-pay

## 2022-09-16 DIAGNOSIS — M25561 Pain in right knee: Secondary | ICD-10-CM | POA: Diagnosis not present

## 2022-09-16 DIAGNOSIS — M5431 Sciatica, right side: Secondary | ICD-10-CM

## 2022-09-16 DIAGNOSIS — Z8739 Personal history of other diseases of the musculoskeletal system and connective tissue: Secondary | ICD-10-CM

## 2022-09-16 MED ORDER — PREDNISONE 20 MG PO TABS: 20.0000 mg | ORAL_TABLET | Freq: Every day | ORAL | 0 refills | Status: DC

## 2022-09-16 MED ORDER — TIZANIDINE HCL 4 MG PO TABS: 4.0000 mg | ORAL_TABLET | Freq: Every day | ORAL | 0 refills | Status: DC

## 2022-09-16 MED ORDER — COLCHICINE 0.6 MG PO TABS: ORAL_TABLET | ORAL | 0 refills | Status: DC

## 2022-09-16 NOTE — ED Triage Notes (Signed)
Pt present c/o gout in his right knee. Pt would like a refill.

## 2022-09-16 NOTE — ED Provider Notes (Signed)
[facility]    CSN: [id number] Arrival date & time: 09/16/22  1032      History   Chief Complaint Chief Complaint  Patient presents with   Gout    HPI Darrell Ritter is a 62 y.o. male who presents complaining with R lateral knee pain x 2 days. States he had gout on this area last month and medrol helped. Has hx of gout sine age 4 years confirmed bu blood work and prednisone has been the only thing that has worked. Per his wife, she does not recall him trying Colcrys, and since he did not have insurance, he has never been placed on prophylactic meds.  He got a message from a primary care Dr clinic and he has called them multiple times and left messages but has not heard back from them. They never left him information of their location. He would also like refill of the Zanaflex which has helped his hip spasm from sciatica. Today has been to see the disability physician and had R knee and R hip xrays, but does not know the results.     Past Medical History:  Diagnosis Date   Gout    Hypertension     Patient Active Problem List   Diagnosis Date Noted   Foot ulcer, right (HCC) 03/23/2016   Leukocytosis 03/23/2016   Acute idiopathic gout 03/23/2016   HTN (hypertension) 03/23/2016   Tobacco abuse 03/23/2016   Hyponatremia 03/23/2016   Cellulitis 03/23/2016   Cellulitis of foot without toes, right 03/23/2016    History reviewed. No pertinent surgical history.     Home Medications    Prior to Admission medications   Medication Sig Start Date End Date Taking? Authorizing Provider  amLODipine (NORVASC) 5 MG tablet Take 1 tablet (5 mg total) by mouth daily. 08/13/22  Yes Carlisle Beers, FNP  Blood Pressure Monitoring (BLOOD PRESSURE CUFF) MISC Take BP 3-4 times per week and write down numbers. 08/13/22  Yes Carlisle Beers, FNP  colchicine 0.6 MG tablet Take 2 now, then repeat again in 1 hour. Staring 7/9 may take one a day for prevention 09/16/22  Yes  Rodriguez-Southworth, Nettie Elm, PA-C  predniSONE (DELTASONE) 20 MG tablet Take 1 tablet (20 mg total) by mouth daily with breakfast. 09/16/22   Rodriguez-Southworth, Nettie Elm, PA-C  tiZANidine (ZANAFLEX) 4 MG tablet Take 1 tablet (4 mg total) by mouth at bedtime. 09/16/22   Rodriguez-Southworth, Nettie Elm, PA-C    Family History History reviewed. No pertinent family history.  Social History Social History   Tobacco Use   Smoking status: Every Day    Types: Cigars   Smokeless tobacco: Never  Vaping Use   Vaping Use: Never used  Substance Use Topics   Alcohol use: Yes    Comment: occasional   Drug use: No     Allergies   Patient has no known allergies.   Review of Systems Review of Systems  As noted in HPI  Physical Exam Triage Vital Signs ED Triage Vitals [09/16/22 1130]  Enc Vitals Group     BP (!) 158/92     Pulse Rate 93     Resp 18     Temp 98.6 F (37 C)     Temp Source Oral     SpO2 95 %     Weight      Height      Head Circumference      Peak Flow      Pain Score  Pain Loc      Pain Edu?      Excl. in GC?    No data found.  Updated Vital Signs BP (!) 158/92 (BP Location: Left Arm)   Pulse 93   Temp 98.6 F (37 C) (Oral)   Resp 18   SpO2 95%   Visual Acuity Right Eye Distance:   Left Eye Distance:   Bilateral Distance:    Right Eye Near:   Left Eye Near:    Bilateral Near:     Physical Exam Vitals and nursing note reviewed.  Constitutional:      General: He is not in acute distress. Eyes:     General: No scleral icterus.    Conjunctiva/sclera: Conjunctivae normal.  Pulmonary:     Effort: Pulmonary effort is normal.  Musculoskeletal:     Cervical back: Neck supple.     Comments: R KNEE- with no swelling, redness, warmth or effusion. Has local tenderness on the R lateral collateral ligament region, but not on the bone. Straightening his knee is difficult due to pain.  Has  mild crepitations all over knee.   R HIP- with local tenderness  on R sacral region and Lateral hip area. ROM provoked pain on mentioned areas.   Neurological:     Mental Status: He is alert and oriented to person, place, and time.     Gait: Gait normal.     Deep Tendon Reflexes: Reflexes normal.  Psychiatric:        Mood and Affect: Mood normal.        Behavior: Behavior normal.        Thought Content: Thought content normal.        Judgment: Judgment normal.      UC Treatments / Results  Labs (all labs ordered are listed, but only abnormal results are displayed) Labs Reviewed - No data to display  EKG   Radiology No results found.  Procedures Procedures (including critical care time)  Medications Ordered in UC Medications - No data to display  Initial Impression / Assessment and Plan / UC Course  I have reviewed the triage vital signs and the nursing notes.  R knee arthralgia, does not look typical of gout  I explained to him he can't have prednisone often, so I will have him to Colcrys for acute pain, and then one dose a day for prevention. I talked with him about diet restrictions to avoid gout. I refilled the Zanaflex and gave him a couple of PCP groups he can call to see if he could establish with them.  If the Colcrys does not help, then I gave him a written Rx for prednisone he can try.    Final Clinical Impressions(s) / UC Diagnoses   Final diagnoses:  Arthralgia of knee, right     Discharge Instructions      Call the clinics above and see if you could establish care with one of them     ED Prescriptions     Medication Sig Dispense Auth. Provider   colchicine 0.6 MG tablet Take 2 now, then repeat again in 1 hour. Staring 7/9 may take one a day for prevention 30 tablet Rodriguez-Southworth, Nettie Elm, PA-C   predniSONE (DELTASONE) 20 MG tablet  (Status: Discontinued) Take 1 tablet (20 mg total) by mouth daily with breakfast. 3 tablet Rodriguez-Southworth, Nettie Elm, PA-C   tiZANidine (ZANAFLEX) 4 MG tablet Take 1 tablet  (4 mg total) by mouth at bedtime. 30 tablet Rodriguez-Southworth, Nettie Elm, [place]  predniSONE (DELTASONE) 20 MG tablet Take 1 tablet (20 mg total) by mouth daily with breakfast. 3 tablet Rodriguez-Southworth, Nettie Elm, PA-C      PDMP not reviewed this encounter.   Garey Ham, PA-C 09/16/22 1213

## 2022-09-16 NOTE — Discharge Instructions (Signed)
Call the clinics above and see if you could establish care with one of them

## 2022-11-18 NOTE — Progress Notes (Unsigned)
  CC: Establishment of care  HPI:  [name] is a 62 y.o. male with a past medical history of HTN and tophaceous gout and presenting to the clinic today for establishment of care. Please see problem based assessment and plan for additional details.  Past Medical History:  Diagnosis Date   Gout    Hypertension       Current Outpatient Medications (Cardiovascular):    amLODipine (NORVASC) 5 MG tablet, Take 1 tablet (5 mg total) by mouth daily.   Current Outpatient Medications (Analgesics):    colchicine 0.6 MG tablet, Take 2 now, then repeat again in 1 hour. Staring 7/9 may take one a day for prevention   Current Outpatient Medications (Other):    Blood Pressure Monitoring (BLOOD PRESSURE CUFF) MISC, Take BP 3-4 times per week and write down numbers.  Allergies: NKDA  Family Hx:  HTN: None DMII:none  Cancer: Lung cancer in father.   Social History:  10-15 cigars per day. 20 years of smoking.  Beers 40 oz 1-2 per day.   Review of Systems: ROS negative except for what is noted on the assessment and plan.  Vitals:   11/19/22 1116  BP: (!) 179/88  Pulse: 87  Temp: 98.2 F (36.8 C)  TempSrc: Oral  SpO2: 99%  Weight: 183 lb 8 oz (83.2 kg)  Height: 5\' 8"  (1.727 m)     Physical Exam: General: Well appearing, NAD HENT: normocephalic, atraumatic EYES: conjunctiva non-erythematous, no scleral icterus CV: regular rate, normal rhythm, no murmurs, rubs, gallops. Pulmonary: normal work of breathing on RA, lungs clear to auscultation, no rales, wheezes, rhonchi Abdominal: non-distended, soft, non-tender to palpation, normal BS Skin: Warm and dry, no rashes or lesions Neurological: MS: awake, alert and oriented x3, normal speech and fund of knowledge Motor: moves all extremities antigravity Psych: normal affect    Assessment & Plan:   No problem-specific Assessment & Plan notes found for this encounter.   See Encounters Tab for problem based  charting.  Patient discussed with Dr. {NAMES:[id number]::"Guilloud","Hoffman","Mullen","Narendra","Vincent","Machen","Lau","Hatcher"} Gwenevere Abbot, Darrell Ritter Eligha Bridegroom. [facility] Internal Medicine Residency, PGY-3   PHQ 9 11 GAD 12

## 2022-11-19 ENCOUNTER — Encounter: Payer: Self-pay | Admitting: Internal Medicine

## 2022-11-19 ENCOUNTER — Ambulatory Visit: Payer: Medicaid Other | Admitting: Internal Medicine

## 2022-11-19 ENCOUNTER — Other Ambulatory Visit: Payer: Self-pay

## 2022-11-19 VITALS — BP 179/88 | HR 87 | Temp 98.2°F | Ht 68.0 in | Wt 183.5 lb

## 2022-11-19 DIAGNOSIS — F17210 Nicotine dependence, cigarettes, uncomplicated: Secondary | ICD-10-CM | POA: Diagnosis not present

## 2022-11-19 DIAGNOSIS — F339 Major depressive disorder, recurrent, unspecified: Secondary | ICD-10-CM

## 2022-11-19 DIAGNOSIS — E871 Hypo-osmolality and hyponatremia: Secondary | ICD-10-CM

## 2022-11-19 DIAGNOSIS — M109 Gout, unspecified: Secondary | ICD-10-CM | POA: Diagnosis not present

## 2022-11-19 DIAGNOSIS — Z0000 Encounter for general adult medical examination without abnormal findings: Secondary | ICD-10-CM

## 2022-11-19 DIAGNOSIS — F411 Generalized anxiety disorder: Secondary | ICD-10-CM | POA: Diagnosis not present

## 2022-11-19 DIAGNOSIS — R011 Cardiac murmur, unspecified: Secondary | ICD-10-CM | POA: Diagnosis not present

## 2022-11-19 DIAGNOSIS — D696 Thrombocytopenia, unspecified: Secondary | ICD-10-CM

## 2022-11-19 DIAGNOSIS — Z114 Encounter for screening for human immunodeficiency virus [HIV]: Secondary | ICD-10-CM

## 2022-11-19 DIAGNOSIS — I10 Essential (primary) hypertension: Secondary | ICD-10-CM

## 2022-11-19 DIAGNOSIS — Z720 Tobacco use: Secondary | ICD-10-CM

## 2022-11-19 DIAGNOSIS — M1A9XX1 Chronic gout, unspecified, with tophus (tophi): Secondary | ICD-10-CM

## 2022-11-19 DIAGNOSIS — F1090 Alcohol use, unspecified, uncomplicated: Secondary | ICD-10-CM

## 2022-11-19 DIAGNOSIS — Z1159 Encounter for screening for other viral diseases: Secondary | ICD-10-CM

## 2022-11-19 DIAGNOSIS — Z13220 Encounter for screening for lipoid disorders: Secondary | ICD-10-CM

## 2022-11-19 MED ORDER — LOSARTAN POTASSIUM 50 MG PO TABS
50.0000 mg | ORAL_TABLET | Freq: Every day | ORAL | 11 refills | Status: AC
Start: 2022-11-19 — End: 2023-11-19

## 2022-11-19 MED ORDER — ALLOPURINOL 100 MG PO TABS
100.0000 mg | ORAL_TABLET | Freq: Every day | ORAL | 11 refills | Status: DC
Start: 2022-11-19 — End: 2022-11-19

## 2022-11-19 MED ORDER — LOSARTAN POTASSIUM 50 MG PO TABS
50.0000 mg | ORAL_TABLET | Freq: Every day | ORAL | 11 refills | Status: DC
Start: 2022-11-19 — End: 2022-11-19

## 2022-11-19 MED ORDER — ALLOPURINOL 100 MG PO TABS
100.0000 mg | ORAL_TABLET | Freq: Every day | ORAL | 11 refills | Status: AC
Start: 2022-11-19 — End: 2023-11-19

## 2022-11-19 MED ORDER — DULOXETINE HCL 30 MG PO CPEP
30.0000 mg | ORAL_CAPSULE | Freq: Every day | ORAL | 2 refills | Status: DC
Start: 2022-11-19 — End: 2023-04-18

## 2022-11-19 NOTE — Patient Instructions (Addendum)
Mr.Alexandria A Therrell, it was a pleasure seeing you today! You endorsed feeling well today. Below are some of the things we talked about this visit. We look forward to seeing you in the follow up appointment!  Today we discussed: We will check lab work today.  I will start you on a medicine called allupurinol for your gout. Use colchicine as needed.  I will start you on blood pressure medicine called losartan 50 mg every day. Continue taking amlodipine.   I have ordered the following labs today:  Lab Orders         CBC with Diff         CMP14 + Anion Gap         Lipid Profile         Uric acid         Hepatitis C Ab reflex to Quant PCR         HIV antibody (with reflex)         TSH       Referrals ordered today:   Referral Orders         Ambulatory referral to Rheumatology      I have ordered the following medication/changed the following medications:   Stop the following medications: Medications Discontinued During This Encounter  Medication Reason   predniSONE (DELTASONE) 20 MG tablet Patient has not taken in last 30 days   tiZANidine (ZANAFLEX) 4 MG tablet Patient has not taken in last 30 days     Start the following medications: Meds ordered this encounter  Medications   allopurinol (ZYLOPRIM) 100 MG tablet    Sig: Take 1 tablet (100 mg total) by mouth daily.    Dispense:  30 tablet    Refill:  11   losartan (COZAAR) 50 MG tablet    Sig: Take 1 tablet (50 mg total) by mouth daily.    Dispense:  30 tablet    Refill:  11     Follow-up: 1 month follow up   Please make sure to arrive 15 minutes prior to your next appointment. If you arrive late, you may be asked to reschedule.   We look forward to seeing you next time. Please call our clinic at [phone] if you have any questions or concerns. The best time to call is Monday-Friday from 9am-4pm, but there is someone available 24/7. If after hours or the weekend, call the main hospital number and ask for the Internal  Medicine Resident On-Call. If you need medication refills, please notify your pharmacy one week in advance and they will send [place] a request.  Thank you for letting us take part in your care. Wishing you the best!  Thank you, Gwenevere Abbot, MD

## 2022-11-20 LAB — CMP14 + ANION GAP
ALT: 95 IU/L — ABNORMAL HIGH (ref 0–44)
AST: 99 IU/L — ABNORMAL HIGH (ref 0–40)
Albumin: 5 g/dL — ABNORMAL HIGH (ref 3.8–4.9)
Alkaline Phosphatase: 115 IU/L (ref 44–121)
Anion Gap: 24 mmol/L — ABNORMAL HIGH (ref 10.0–18.0)
BUN/Creatinine Ratio: 12 (ref 10–24)
BUN: 9 mg/dL (ref 8–27)
Bilirubin Total: 1.3 mg/dL — ABNORMAL HIGH (ref 0.0–1.2)
CO2: 17 mmol/L — ABNORMAL LOW (ref 20–29)
Calcium: 10 mg/dL (ref 8.6–10.2)
Chloride: 89 mmol/L — ABNORMAL LOW (ref 96–106)
Creatinine, Ser: 0.75 mg/dL — ABNORMAL LOW (ref 0.76–1.27)
Globulin, Total: 2.9 g/dL (ref 1.5–4.5)
Glucose: 78 mg/dL (ref 70–99)
Potassium: 4.7 mmol/L (ref 3.5–5.2)
Sodium: 130 mmol/L — ABNORMAL LOW (ref 134–144)
Total Protein: 7.9 g/dL (ref 6.0–8.5)
eGFR: 103 mL/min/{1.73_m2} (ref >59)

## 2022-11-20 LAB — HCV AB W REFLEX TO QUANT PCR: HCV Ab: NONREACTIVE

## 2022-11-20 LAB — CBC WITH DIFFERENTIAL/PLATELET
Basophils Absolute: 0.1 10*3/uL (ref 0.0–0.2)
Basos: 1 %
EOS (ABSOLUTE): 0 10*3/uL (ref 0.0–0.4)
Eos: 0 %
Hematocrit: 47.5 % (ref 37.5–51.0)
Hemoglobin: 16.7 g/dL (ref 13.0–17.7)
Immature Grans (Abs): 0 10*3/uL (ref 0.0–0.1)
Immature Granulocytes: 0 %
Lymphocytes Absolute: 0.5 10*3/uL — ABNORMAL LOW (ref 0.7–3.1)
Lymphs: 7 %
MCH: 34.1 pg — ABNORMAL HIGH (ref 26.6–33.0)
MCHC: 35.2 g/dL (ref 31.5–35.7)
MCV: 97 fL (ref 79–97)
Monocytes Absolute: 0.3 10*3/uL (ref 0.1–0.9)
Monocytes: 4 %
Neutrophils Absolute: 6.4 10*3/uL (ref 1.4–7.0)
Neutrophils: 88 %
Platelets: 141 10*3/uL — ABNORMAL LOW (ref 150–450)
RBC: 4.9 x10E6/uL (ref 4.14–5.80)
RDW: 12.8 % (ref 11.6–15.4)
WBC: 7.3 10*3/uL (ref 3.4–10.8)

## 2022-11-20 LAB — LIPID PANEL
Chol/HDL Ratio: 1.7 ratio (ref 0.0–5.0)
Cholesterol, Total: 218 mg/dL — ABNORMAL HIGH (ref 100–199)
HDL: 127 mg/dL (ref >39)
LDL Chol Calc (NIH): 77 mg/dL (ref 0–99)
Triglycerides: 82 mg/dL (ref 0–149)
VLDL Cholesterol Cal: 14 mg/dL (ref 5–40)

## 2022-11-20 LAB — URIC ACID: Uric Acid: 7.6 mg/dL (ref 3.8–8.4)

## 2022-11-20 LAB — TSH: TSH: 1.6 u[IU]/mL (ref 0.450–4.500)

## 2022-11-20 LAB — HIV ANTIBODY (ROUTINE TESTING W REFLEX): HIV Screen 4th Generation wRfx: NONREACTIVE

## 2022-11-20 LAB — HCV INTERPRETATION

## 2022-11-21 DIAGNOSIS — F1090 Alcohol use, unspecified, uncomplicated: Secondary | ICD-10-CM | POA: Insufficient documentation

## 2022-11-21 DIAGNOSIS — Z0000 Encounter for general adult medical examination without abnormal findings: Secondary | ICD-10-CM | POA: Insufficient documentation

## 2022-11-21 DIAGNOSIS — F411 Generalized anxiety disorder: Secondary | ICD-10-CM | POA: Insufficient documentation

## 2022-11-21 DIAGNOSIS — F329 Major depressive disorder, single episode, unspecified: Secondary | ICD-10-CM | POA: Insufficient documentation

## 2022-11-21 DIAGNOSIS — M1A9XX1 Chronic gout, unspecified, with tophus (tophi): Secondary | ICD-10-CM | POA: Insufficient documentation

## 2022-11-21 DIAGNOSIS — D696 Thrombocytopenia, unspecified: Secondary | ICD-10-CM | POA: Insufficient documentation

## 2022-11-21 DIAGNOSIS — R011 Cardiac murmur, unspecified: Secondary | ICD-10-CM | POA: Insufficient documentation

## 2022-11-21 MED ORDER — COLCHICINE 0.6 MG PO TABS
ORAL_TABLET | ORAL | 0 refills | Status: AC
Start: 2022-11-21 — End: ?

## 2022-11-21 MED ORDER — NALTREXONE HCL 50 MG PO TABS: 50.0000 mg | ORAL_TABLET | Freq: Every day | ORAL | 11 refills | Status: AC

## 2022-11-21 NOTE — Assessment & Plan Note (Signed)
Pt with hx of HTN who has been taking amlodipine 5 mg every day. His blood pressure is uncontrolled. Given this is his first visit here, lab work was obtained that showed normal renal function, so will add losartan 50 mg every day. Pt is completely asymptomatic from his HTN. Will follow up in one month for recheck of his BP and to repeat BMP.

## 2022-11-21 NOTE — Assessment & Plan Note (Signed)
Mild at 141. Suspect secondary to alcohol use. Counseled on cessation. Does not warrant work up at this time but if it declines further, will certainly need work up. Hopeful that with start of naltrexone, he can decrease eliminate his alcohol use, which will improve his overall health.

## 2022-11-21 NOTE — Assessment & Plan Note (Addendum)
Pt with GAD. He states this has always been an issue for him. Will start pt on cymbalta 30 mg every day and refer him to counseling as mentioned previously. TSH checked and was normal.

## 2022-11-21 NOTE — Assessment & Plan Note (Signed)
3/6 systolic murmur noted on exam. Will get echocardiogram to evaluate this further. Pt is HDS with no distress.

## 2022-11-21 NOTE — Assessment & Plan Note (Signed)
Hep C and HIV ordered this visit Screening lipid panel ordered this visit Needs A1c at next visit to screen for DM.

## 2022-11-21 NOTE — Assessment & Plan Note (Addendum)
Pt with alcohol use disorder who is currently drinking 1-2 40 oz beers per day. States he drinks more at time and was drinking more previously. He does want to cut back. He has not been hospitalized 2/2 to his drinking or had withdrawal symptoms. Discussed with pt using naltrexone 50 daily to help cut his cravings and he is in agreement. LFT obtained showed elevation along with elevated bilirubin. No symptoms endorsed so will obtain RUQ ultrasound. Naltrexone 50 mg every day ordered

## 2022-11-21 NOTE — Assessment & Plan Note (Signed)
Pt with significant tobacco use disorder. Discussed cessation but pt not ready at the moment. Continue discussions at subsequent visits.

## 2022-11-21 NOTE — Assessment & Plan Note (Signed)
Hyponatremia low at 130. Completely asymptomatic. Suspect secondary to alcohol use. Can trend at subsequent visits. Discussed cessation of alcohol which should improve sodium level.

## 2022-11-21 NOTE — Assessment & Plan Note (Addendum)
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 He has not been on maintenance therapy for his gout previously. States received care through urgent care as needed previously. Given pt is not in acute flare, will obtain uric acid and start him on allupurinol 100 mg daily for prevention. Will prescribe colchicine for acute therapy. Will titrate allopurinol to uric acid <6.0. Will refer to rheumatology as well given tophi development.

## 2022-11-21 NOTE — Assessment & Plan Note (Signed)
Pt with MDD who states his mood has worsened since both of his daughters passed away in the last 2 years. PHQ9 suggest moderate symptom burden. He will benefit from counseling and pharmacotherapy. Will start pt on cymbalta 30 mg every day and refer him to [facility] for counseling.

## 2022-11-27 ENCOUNTER — Telehealth: Payer: Self-pay

## 2022-11-27 NOTE — Telephone Encounter (Signed)
Pt's mother requesting lab results, please call back.

## 2022-11-27 NOTE — Telephone Encounter (Signed)
Mother requesting to be called as well.

## 2022-12-03 NOTE — Progress Notes (Signed)
[facility] Attending  Case discussed with the resident at the time of the visit.  We reviewed the resident's history and exam and pertinent patient test results.  I agree with the assessment, diagnosis, and plan of care documented in the resident's note.  Debe Coder, MD

## 2022-12-20 ENCOUNTER — Encounter: Payer: Medicaid Other | Admitting: Student

## 2022-12-23 ENCOUNTER — Institutional Professional Consult (permissible substitution): Payer: Medicaid Other | Admitting: Licensed Clinical Social Worker

## 2022-12-24 ENCOUNTER — Ambulatory Visit (HOSPITAL_COMMUNITY): Admission: RE | Admit: 2022-12-24 | Payer: Medicaid Other | Source: Ambulatory Visit

## 2023-01-13 ENCOUNTER — Encounter: Payer: Medicaid Other | Admitting: Internal Medicine

## 2023-02-11 ENCOUNTER — Encounter: Payer: Medicaid Other | Admitting: Student

## 2023-04-16 ENCOUNTER — Other Ambulatory Visit: Payer: Self-pay | Admitting: Internal Medicine

## 2023-04-16 DIAGNOSIS — F411 Generalized anxiety disorder: Secondary | ICD-10-CM
